# Patient Record
Sex: Female | Born: 1960
Health system: Southern US, Community
[De-identification: ages and names within clinical notes are randomized; demographics above are authoritative.]

## PROBLEM LIST (undated history)

## (undated) DIAGNOSIS — IMO0002 Reserved for concepts with insufficient information to code with codable children: Secondary | ICD-10-CM

## (undated) DIAGNOSIS — G5603 Carpal tunnel syndrome, bilateral upper limbs: Secondary | ICD-10-CM

## (undated) DIAGNOSIS — F419 Anxiety disorder, unspecified: Secondary | ICD-10-CM

## (undated) DIAGNOSIS — R55 Syncope and collapse: Secondary | ICD-10-CM

## (undated) DIAGNOSIS — E785 Hyperlipidemia, unspecified: Secondary | ICD-10-CM

## (undated) DIAGNOSIS — F32A Depression, unspecified: Secondary | ICD-10-CM

## (undated) DIAGNOSIS — M5412 Radiculopathy, cervical region: Secondary | ICD-10-CM

## (undated) DIAGNOSIS — M5416 Radiculopathy, lumbar region: Secondary | ICD-10-CM

## (undated) HISTORY — DX: Hyperlipidemia, unspecified: E78.5

## (undated) HISTORY — DX: Radiculopathy, lumbar region: M54.16

## (undated) HISTORY — DX: Carpal tunnel syndrome, bilateral upper limbs: G56.03

## (undated) HISTORY — DX: Radiculopathy, cervical region: M54.12

## (undated) HISTORY — DX: Reserved for concepts with insufficient information to code with codable children: IMO0002

## (undated) HISTORY — PX: NO PAST SURGERIES: SHX2092

---

## 2005-05-04 ENCOUNTER — Other Ambulatory Visit: Admission: RE | Admit: 2005-05-04 | Discharge: 2005-05-04 | Payer: Self-pay | Admitting: Internal Medicine

## 2005-05-21 ENCOUNTER — Encounter: Admission: RE | Admit: 2005-05-21 | Discharge: 2005-05-21 | Payer: Self-pay | Admitting: Internal Medicine

## 2005-06-14 ENCOUNTER — Encounter: Admission: RE | Admit: 2005-06-14 | Discharge: 2005-06-14 | Payer: Self-pay | Admitting: Internal Medicine

## 2007-01-25 ENCOUNTER — Encounter: Admission: RE | Admit: 2007-01-25 | Discharge: 2007-01-25 | Payer: Self-pay | Admitting: *Deleted

## 2007-01-28 ENCOUNTER — Encounter: Payer: Self-pay | Admitting: Nurse Practitioner

## 2008-10-22 ENCOUNTER — Ambulatory Visit: Payer: Self-pay | Admitting: Gastroenterology

## 2008-10-22 DIAGNOSIS — K648 Other hemorrhoids: Secondary | ICD-10-CM | POA: Insufficient documentation

## 2008-10-22 DIAGNOSIS — K219 Gastro-esophageal reflux disease without esophagitis: Secondary | ICD-10-CM | POA: Insufficient documentation

## 2008-10-22 LAB — CONVERTED CEMR LAB: H Pylori IgG: NEGATIVE

## 2008-11-08 ENCOUNTER — Ambulatory Visit: Payer: Self-pay | Admitting: Internal Medicine

## 2008-11-16 ENCOUNTER — Encounter: Payer: Self-pay | Admitting: Nurse Practitioner

## 2009-01-10 ENCOUNTER — Other Ambulatory Visit: Admission: RE | Admit: 2009-01-10 | Discharge: 2009-01-10 | Payer: Self-pay | Admitting: Internal Medicine

## 2009-01-11 ENCOUNTER — Ambulatory Visit: Payer: Self-pay | Admitting: Internal Medicine

## 2009-01-11 ENCOUNTER — Encounter: Payer: Self-pay | Admitting: Nurse Practitioner

## 2009-02-04 ENCOUNTER — Encounter: Payer: Self-pay | Admitting: Nurse Practitioner

## 2009-02-04 ENCOUNTER — Ambulatory Visit: Payer: Self-pay | Admitting: Internal Medicine

## 2009-03-03 ENCOUNTER — Ambulatory Visit: Payer: Self-pay | Admitting: Internal Medicine

## 2009-03-04 ENCOUNTER — Encounter: Payer: Self-pay | Admitting: Nurse Practitioner

## 2009-03-04 ENCOUNTER — Ambulatory Visit: Payer: Self-pay | Admitting: Internal Medicine

## 2009-03-07 ENCOUNTER — Encounter: Admission: RE | Admit: 2009-03-07 | Discharge: 2009-03-07 | Payer: Self-pay | Admitting: Internal Medicine

## 2009-03-07 ENCOUNTER — Encounter: Payer: Self-pay | Admitting: Nurse Practitioner

## 2009-07-05 ENCOUNTER — Ambulatory Visit: Payer: Self-pay | Admitting: Internal Medicine

## 2009-07-05 ENCOUNTER — Encounter: Payer: Self-pay | Admitting: Nurse Practitioner

## 2009-10-25 ENCOUNTER — Encounter: Payer: Self-pay | Admitting: Nurse Practitioner

## 2009-10-25 ENCOUNTER — Ambulatory Visit: Payer: Self-pay | Admitting: Internal Medicine

## 2009-10-26 ENCOUNTER — Telehealth: Payer: Self-pay | Admitting: Gastroenterology

## 2009-10-28 ENCOUNTER — Ambulatory Visit: Payer: Self-pay | Admitting: Gastroenterology

## 2009-10-28 ENCOUNTER — Encounter: Payer: Self-pay | Admitting: Gastroenterology

## 2009-10-28 DIAGNOSIS — K625 Hemorrhage of anus and rectum: Secondary | ICD-10-CM | POA: Insufficient documentation

## 2009-11-17 ENCOUNTER — Ambulatory Visit: Payer: Self-pay | Admitting: Gastroenterology

## 2009-11-21 ENCOUNTER — Telehealth: Payer: Self-pay | Admitting: Gastroenterology

## 2009-11-22 ENCOUNTER — Encounter: Payer: Self-pay | Admitting: Gastroenterology

## 2009-12-28 ENCOUNTER — Ambulatory Visit: Payer: Self-pay | Admitting: Gastroenterology

## 2009-12-28 DIAGNOSIS — K5090 Crohn's disease, unspecified, without complications: Secondary | ICD-10-CM | POA: Insufficient documentation

## 2009-12-28 LAB — CONVERTED CEMR LAB
Albumin: 3.7 g/dL (ref 3.5–5.2)
Alkaline Phosphatase: 58 units/L (ref 39–117)
BUN: 9 mg/dL (ref 6–23)
Basophils Relative: 0.1 % (ref 0.0–3.0)
CO2: 30 meq/L (ref 19–32)
Calcium: 8.9 mg/dL (ref 8.4–10.5)
Chloride: 102 meq/L (ref 96–112)
Eosinophils Relative: 1.4 % (ref 0.0–5.0)
GFR calc non Af Amer: 114.24 mL/min (ref >60)
Glucose, Bld: 83 mg/dL (ref 70–99)
MCV: 97.5 fL (ref 78.0–100.0)
Monocytes Absolute: 0.6 10*3/uL (ref 0.1–1.0)
Monocytes Relative: 6.5 % (ref 3.0–12.0)
Neutrophils Relative %: 63.6 % (ref 43.0–77.0)
Platelets: 320 10*3/uL (ref 150.0–400.0)
Potassium: 4.2 meq/L (ref 3.5–5.1)
RBC: 3.88 M/uL (ref 3.87–5.11)
Sodium: 138 meq/L (ref 135–145)
Total Protein: 7 g/dL (ref 6.0–8.3)
WBC: 8.7 10*3/uL (ref 4.5–10.5)

## 2010-01-05 ENCOUNTER — Telehealth: Payer: Self-pay | Admitting: Gastroenterology

## 2010-01-20 ENCOUNTER — Telehealth: Payer: Self-pay | Admitting: Gastroenterology

## 2010-03-16 ENCOUNTER — Ambulatory Visit: Payer: Self-pay | Admitting: Internal Medicine

## 2010-03-16 ENCOUNTER — Encounter: Payer: Self-pay | Admitting: Gastroenterology

## 2010-05-24 ENCOUNTER — Ambulatory Visit: Payer: Self-pay | Admitting: Internal Medicine

## 2010-08-31 ENCOUNTER — Ambulatory Visit: Payer: Self-pay | Admitting: Internal Medicine

## 2010-09-05 ENCOUNTER — Encounter: Admission: RE | Admit: 2010-09-05 | Discharge: 2010-09-05 | Payer: Self-pay | Admitting: Internal Medicine

## 2010-10-27 ENCOUNTER — Ambulatory Visit: Payer: Self-pay | Admitting: Internal Medicine

## 2010-12-12 ENCOUNTER — Encounter
Admission: RE | Admit: 2010-12-12 | Discharge: 2010-12-12 | Payer: Self-pay | Source: Home / Self Care | Attending: Sports Medicine | Admitting: Sports Medicine

## 2010-12-12 NOTE — Procedures (Signed)
Summary: Colonoscopy  Patient: Jordan Carrillo Note: All result statuses are Final unless otherwise noted.  Tests: (1) Colonoscopy (COL)   COL Colonoscopy           DONE      [facility]     [address]     [place], [place]  [address]           COLONOSCOPY PROCEDURE REPORT           PATIENT:  Jordan Carrillo, Jordan Carrillo  MR#:  [id number]     BIRTHDATE:  [birth year], 48 yrs. old  GENDER:  female           ENDOSCOPIST:  Barbette Hair. Arlyce Dice, MD     Referred by:           PROCEDURE DATE:  11/17/2009     PROCEDURE:  Colonoscopy with biopsy     ASA CLASS:  Class I     INDICATIONS:  rectal bleeding           MEDICATIONS:   Fentanyl 75 mcg IV, Versed 7 mg IV           DESCRIPTION OF PROCEDURE:   After the risks benefits and     alternatives of the procedure were thoroughly explained, informed     consent was obtained.  Digital rectal exam was performed and     revealed no abnormalities.   The LB [id number] endoscope     was introduced through the anus and advanced to the terminal ileum     which was intubated for a short distance, without limitations.     The quality of the prep was excellent, using MoviPrep.  The     instrument was then slowly withdrawn as the colon was fully     examined.     <<PROCEDUREIMAGES>>     FINDINGS:  Colitis was found. Moderate colitis characterized by     deeply erythematous mucosa, mucosal friability with erosions,     involving the rectum up to 10cm, and a 20cm segment of descending     colon up to the splenic flexure. Intervening areas are normal (see     image1, image15, image16, image17, image22, and image23). Multiple     bxs taken  This was otherwise a normal examination of the colon     (see image5, image6, image7, image8, image9, image10, image11,     image13, image20, and image21).   Retroflexed views in     the rectum revealed no abnormalities.    The scope was then     withdrawn from the patient and the procedure completed.           COMPLICATIONS:  None           ENDOSCOPIC IMPRESSION:     1) Segmental Colitis     2) Otherwise normal examination     RECOMMENDATIONS:     1) Await biopsy results     2) begin lialda 4.8gm daily     3) call office next 1-3 days to schedule followup visit in 2-3     weeks           REPEAT EXAM:  No           ______________________________     Barbette Hair. Arlyce Dice, MD           CC:  Sharlet Salina, MD           n.     Rosalie DoctorMolly Maduro  Rosalio Macadamia at 11/17/2009 03:35 PM           Page 2 of 3   Jordan Carrillo, Jordan Carrillo, [id number]  Note: An exclamation mark (!) indicates a result that was not dispersed into the flowsheet. Document Creation Date: 11/17/2009 3:35 PM _______________________________________________________________________  (1) Order result status: Final Collection or observation date-time: 11/17/2009 15:11 Requested date-time:  Receipt date-time:  Reported date-time:  Referring Physician:   Ordering Physician: Melvia Heaps [phone]) Specimen Source:  Source: Launa Grill Order Number: [id number] Lab site:

## 2010-12-12 NOTE — Letter (Signed)
Summary: 4/22-4/23 notes/M Baxley,MD  4/22-4/23 notes/M Baxley,MD   Imported By: Lester Arthur 11/23/2009 12:41:56  _____________________________________________________________________  External Attachment:    Type:   Image     Comment:   External Document

## 2010-12-12 NOTE — Assessment & Plan Note (Signed)
Summary: COL F/U.Marland KitchenMarland KitchenAS.   History of Present Illness Visit Type: Follow-up Visit Primary GI MD: Melvia Heaps MD [facility] Primary Provider: Marlan Palau, MD Requesting Provider: Marlan Palau, MD Chief Complaint: follow-up colonoscopy/Pt. denies any GI complaints at this time History of Present Illness:   Ms. Barhorst has returned on colonoscopy which demonstrated a segmental colitis.  Biopsies were consistent with inflammatory bowel disease.  I am lialda 4.8 g daily she has had a complete response characterized by the absence of pain, diarrhea or bleeding.  She finished her prescription of lialda one week ago and does not wish to resume it.  She is going to try natural remedies instead.   GI Review of Systems      Denies abdominal pain, acid reflux, belching, bloating, chest pain, dysphagia with liquids, dysphagia with solids, heartburn, loss of appetite, nausea, vomiting, vomiting blood, weight loss, and  weight gain.        Denies anal fissure, black tarry stools, change in bowel habit, constipation, diarrhea, diverticulosis, fecal incontinence, heme positive stool, hemorrhoids, irritable bowel syndrome, jaundice, light color stool, liver problems, rectal bleeding, and  rectal pain.    Current Medications (verified): 1)  Boswellia 375 Mg Caps (Boswellia Serrata) .... One Tablet By Mouth Once Daily 2)  Bromaline Dm 15-1-5 Mg/39ml Elix (Pseudoeph-Bromphen-Dm) .... One Tabelt By Mouth Once Daily  Allergies (verified): No Known Drug Allergies  Past History:  Past Medical History: Reviewed history from 10/28/2009 and no changes required. Current Problems:  INTERNAL HEMORRHOIDS WITHOUT MENTION COMP (ICD-455.0) ESOPHAGEAL REFLUX (ICD-530.81)  Past Surgical History: Reviewed history from 10/22/2008 and no changes required. Unremarkable  Family History: Reviewed history from 10/28/2009 and no changes required. No FH of Colon Cancer: Family History of Uterine Cancer: Mother Colitis ( ?  type)- Sister  Social History: Reviewed history from 10/28/2009 and no changes required. Divorced Patient has never smoked.  Alcohol Use - no Illicit Drug Use - no  Review of Systems  The patient denies allergy/sinus, anemia, anxiety-new, arthritis/joint pain, back pain, blood in urine, breast changes/lumps, change in vision, confusion, cough, coughing up blood, depression-new, fainting, fatigue, fever, headaches-new, hearing problems, heart murmur, heart rhythm changes, itching, menstrual pain, muscle pains/cramps, night sweats, nosebleeds, pregnancy symptoms, shortness of breath, skin rash, sleeping problems, sore throat, swelling of feet/legs, swollen lymph glands, thirst - excessive , urination - excessive , urination changes/pain, urine leakage, vision changes, and voice change.    Vital Signs:  Patient profile:   50 year old female Height:      64 inches Weight:      130 pounds BMI:     22.40 Pulse rate:   84 / minute Pulse rhythm:   regular BP sitting:   102 / 68  (left arm)  Vitals Entered By: Milford Cage NCMA (December 28, 2009 4:08 PM)   Impression & Recommendations:  Problem # 1:  CROHN'S DISEASE (ICD-555.9) Assessment Improved The patient has Crohn's colitis.  She has had an excellent response to lialda.  She wishes to try herbal remedies.  I advised her that she ought to stay on at least a maintenance dose of lialda and, in the absence of this, she likely will have a recurrence.  She has agreed to resume lialda if her symptoms recur.  Other Orders: TLB-CBC Platelet - w/Differential (85025-CBCD) TLB-CMP (Comprehensive Metabolic Pnl) (80053-COMP)  Patient Instructions: 1)  CC Dr. Eden Emms Baxley Prescriptions: LIALDA 1.2 GM TBEC (MESALAMINE) take 2 tabs daily  #60 x 5   Entered  and Authorized by:   Louis Meckel MD   Signed by:   Louis Meckel MD on 12/28/2009   Method used:   Electronically to        [facility] [phone]* (retail)       [address]       [place], [place]  [address]       Ph: [phone]       Fax: [phone]   RxID:   [id number]

## 2010-12-12 NOTE — Letter (Signed)
Summary: 02/26/06-01/28/07 notes/M Baxley,MD  02/26/06-01/28/07 notes/M Baxley,MD   Imported By: Lester Elmo 11/23/2009 12:50:30  _____________________________________________________________________  External Attachment:    Type:   Image     Comment:   External Document

## 2010-12-12 NOTE — Letter (Signed)
Summary: OV note/M Baxley,MD  OV note/M Baxley,MD   Imported By: Lester [place] 11/23/2009 12:46:44  _____________________________________________________________________  External Attachment:    Type:   Image     Comment:   External Document

## 2010-12-12 NOTE — Progress Notes (Signed)
Summary: Triage  Phone Note Call from Patient Call back at Home Phone [phone]   Caller: Patient Call For: Dr. Arlyce Dice Reason for Call: Talk to Nurse Summary of Call: Pt has some questions about her medications and she is having some lower abdominal pain Initial call taken by: Karna Christmas,  January 05, 2010 4:36 PM  Follow-up for Phone Call        Pt. restarted Lialda 2 once daily on 12-31-09. C/O "My belly is growling all the time" and  Intermittent LLQ pain, becomes severe at times. Sees blood on stool after a BM, maybe once daily, but less since she started the Lialda.  Denies fever,n/v.   [facility] PLEASE ADVISE  Follow-up by: Laureen Ochs LPN,  January 05, 2010 4:43 PM  Additional Follow-up for Phone Call Additional follow up Details #1::        add hyomax 0.375mg  two times a day prn Additional Follow-up by: Louis Meckel MD,  January 06, 2010 8:16 AM    Additional Follow-up for Phone Call Additional follow up Details #2::    Above MD orders reviewed with patient. Pt. instructed to call back as needed.  Follow-up by: Laureen Ochs LPN,  January 06, 2010 8:24 AM  New/Updated Medications: HYOSCYAMINE SULFATE CR 0.375 MG  TB12 (HYOSCYAMINE SULFATE) Take one tab twice daily as needed for abd.pain/cramping. Prescriptions: HYOSCYAMINE SULFATE CR 0.375 MG  TB12 (HYOSCYAMINE SULFATE) Take one tab twice daily as needed for abd.pain/cramping.  #30 x 2   Entered by:   Laureen Ochs LPN   Authorized by:   Louis Meckel MD   Signed by:   Laureen Ochs LPN on 09/81/1914   Method used:   Electronically to        [facility]. [phone]* (retail)       [address]       [place], [place]  [address]       Ph: [phone]       Fax: [phone]   RxID:   [id number]

## 2010-12-12 NOTE — Letter (Signed)
Summary: OV notes 3/2-3/26/M Lenord Fellers, MD  OV notes 3/2-3/26/M Lenord Fellers, MD   Imported By: Lester [place] 11/23/2009 12:44:22  _____________________________________________________________________  External Attachment:    Type:   Image     Comment:   External Document

## 2010-12-12 NOTE — Progress Notes (Signed)
Summary: Lab results  Phone Note Call from Patient Call back at Home Phone [phone]   Caller: Patient Call For: Dr. Arlyce Dice Reason for Call: Lab or Test Results Summary of Call: Pt is calling about her lab work results from Feb. Initial call taken by: Karna Christmas,  January 20, 2010 2:31 PM  Follow-up for Phone Call        Pt. is aware that all labs done 12-28-09 are WNL. No further questions. Pt. instructed to call back as needed.  Follow-up by: Laureen Ochs LPN,  January 20, 2010 3:56 PM

## 2010-12-12 NOTE — Miscellaneous (Signed)
  Clinical Lists Changes  Medications: Added new medication of LIALDA 1.2 GM TBEC (MESALAMINE) take 4 tabs daily - Signed Rx of LIALDA 1.2 GM TBEC (MESALAMINE) take 4 tabs daily;  #120 x 5;  Signed;  Entered by: Louis Meckel MD;  Authorized by: Louis Meckel MD;  Method used: Electronically to [facility]. [phone]*, [address] [place], [place], [place]  [address], Ph: [phone], Fax: [phone]    Prescriptions: LIALDA 1.2 GM TBEC (MESALAMINE) take 4 tabs daily  #120 x 5   Entered and Authorized by:   Louis Meckel MD   Signed by:   Louis Meckel MD on 11/17/2009   Method used:   Electronically to        [facility]. [phone]* (retail)       [address]       [place], [place]  [address]       Ph: [phone]       Fax: [phone]   RxID:   [id number]

## 2010-12-12 NOTE — Letter (Signed)
Summary: 01/25/07-11/16/08 notes/MBaxley,MD  01/25/07-11/16/08 notes/MBaxley,MD   Imported By: Lester [place] 11/23/2009 12:48:23  _____________________________________________________________________  External Attachment:    Type:   Image     Comment:   External Document

## 2010-12-12 NOTE — Progress Notes (Signed)
Summary: Triage  Phone Note Call from Patient Call back at [phone]   Caller: Patient Call For: Dr. Arlyce Dice Summary of Call: Pt. said her Lialda medication is making her vomit Initial call taken by: Karna Christmas,  November 21, 2009 3:09 PM  Follow-up for Phone Call        Pt. has Colonoscopy 11-17-09 and began Lialda #4 once daily 11-17-09. She took it Thursday, Friday & Saturday evening, she vomited all three days. She didn't take Sunday or today, no vomiting.She wants to try to take it 2 QAM and 2QPM, instead of all at one time. She will try this and call on Wednesday with an update. Follow-up by: Laureen Ochs LPN,  November 21, 2009 3:55 PM  Additional Follow-up for Phone Call Additional follow up Details #1::        ok Additional Follow-up by: Louis Meckel MD,  November 22, 2009 10:59 AM

## 2010-12-12 NOTE — Letter (Signed)
Summary: Generic Letter  [facility]  [address] [place], [place] [address]   Phone: [phone]  Fax: [phone]    11/22/2009  Jordan Carrillo [address] [place], [place]  [address]  Dear Ms. Larae Grooms,   Your biopsies demonstrated inflammatory changes only.    Please follow the recommendations previously discussed.  Should you have any immediate concerns or questions, feel free to contact me at the office.    Sincerely,  Barbette Hair. Arlyce Dice, M.D., [facility]            Sincerely,   Melvia Heaps MD  Appended Document: Generic Letter November 22, 2009 MRN: [id number]    Jordan Carrillo [address] [place], [place]  [address]      Dear Ms. Larae Grooms,   Your biopsies demonstrated inflammatory changes only.    Please follow the recommendations previously discussed.  Should you have any immediate concerns or questions, feel free to contact me at the office.    Sincerely,  Barbette Hair. Arlyce Dice, M.D., [facility]     Letter mailed 01.12.11

## 2010-12-15 NOTE — Letter (Signed)
Summary: OV note/M Baxley,MD  OV note/M Baxley,MD   Imported By: Lester [place] 11/23/2009 12:51:38  _____________________________________________________________________  External Attachment:    Type:   Image     Comment:   External Document

## 2010-12-26 ENCOUNTER — Encounter: Payer: Self-pay | Admitting: Sports Medicine

## 2011-08-22 ENCOUNTER — Encounter: Payer: Self-pay | Admitting: Internal Medicine

## 2011-08-27 ENCOUNTER — Ambulatory Visit (INDEPENDENT_AMBULATORY_CARE_PROVIDER_SITE_OTHER): Payer: Federal, State, Local not specified - PPO | Admitting: Internal Medicine

## 2011-08-27 ENCOUNTER — Encounter: Payer: Self-pay | Admitting: Internal Medicine

## 2011-08-27 DIAGNOSIS — M5416 Radiculopathy, lumbar region: Secondary | ICD-10-CM

## 2011-08-27 DIAGNOSIS — F32A Depression, unspecified: Secondary | ICD-10-CM

## 2011-08-27 DIAGNOSIS — R51 Headache: Secondary | ICD-10-CM

## 2011-08-27 DIAGNOSIS — M5412 Radiculopathy, cervical region: Secondary | ICD-10-CM

## 2011-08-27 DIAGNOSIS — R519 Headache, unspecified: Secondary | ICD-10-CM

## 2011-08-27 DIAGNOSIS — M6780 Other specified disorders of synovium and tendon, unspecified site: Secondary | ICD-10-CM

## 2011-08-27 DIAGNOSIS — IMO0001 Reserved for inherently not codable concepts without codable children: Secondary | ICD-10-CM

## 2011-08-27 DIAGNOSIS — M94261 Chondromalacia, right knee: Secondary | ICD-10-CM

## 2011-08-27 DIAGNOSIS — F3289 Other specified depressive episodes: Secondary | ICD-10-CM

## 2011-08-27 DIAGNOSIS — IMO0002 Reserved for concepts with insufficient information to code with codable children: Secondary | ICD-10-CM

## 2011-08-27 DIAGNOSIS — M719 Bursopathy, unspecified: Secondary | ICD-10-CM

## 2011-08-27 DIAGNOSIS — M797 Fibromyalgia: Secondary | ICD-10-CM

## 2011-08-27 DIAGNOSIS — G5603 Carpal tunnel syndrome, bilateral upper limbs: Secondary | ICD-10-CM

## 2011-08-27 DIAGNOSIS — M9420 Chondromalacia, unspecified site: Secondary | ICD-10-CM

## 2011-08-27 DIAGNOSIS — G5600 Carpal tunnel syndrome, unspecified upper limb: Secondary | ICD-10-CM

## 2011-08-27 DIAGNOSIS — M6790 Unspecified disorder of synovium and tendon, unspecified site: Secondary | ICD-10-CM

## 2011-08-27 DIAGNOSIS — F329 Major depressive disorder, single episode, unspecified: Secondary | ICD-10-CM

## 2011-09-08 DIAGNOSIS — G5603 Carpal tunnel syndrome, bilateral upper limbs: Secondary | ICD-10-CM | POA: Insufficient documentation

## 2011-09-08 DIAGNOSIS — M5416 Radiculopathy, lumbar region: Secondary | ICD-10-CM | POA: Insufficient documentation

## 2011-09-08 DIAGNOSIS — M5412 Radiculopathy, cervical region: Secondary | ICD-10-CM | POA: Insufficient documentation

## 2011-09-08 DIAGNOSIS — M6780 Other specified disorders of synovium and tendon, unspecified site: Secondary | ICD-10-CM | POA: Insufficient documentation

## 2011-09-08 DIAGNOSIS — M797 Fibromyalgia: Secondary | ICD-10-CM | POA: Insufficient documentation

## 2011-09-08 DIAGNOSIS — M94261 Chondromalacia, right knee: Secondary | ICD-10-CM | POA: Insufficient documentation

## 2011-09-08 DIAGNOSIS — F339 Major depressive disorder, recurrent, unspecified: Secondary | ICD-10-CM | POA: Insufficient documentation

## 2011-09-08 DIAGNOSIS — R519 Headache, unspecified: Secondary | ICD-10-CM | POA: Insufficient documentation
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
## 2011-09-08 NOTE — Patient Instructions (Signed)
Continue Zoloft 100 mg daily as prescribed. Take Sterapred DS 10 mg 6 day dosepak for episode of headache and musculoskeletal pain. Return for fasting lab work in the near future. FMLA form completed today during her visit.

## 2011-10-08 ENCOUNTER — Other Ambulatory Visit: Payer: Self-pay | Admitting: Internal Medicine

## 2011-10-08 ENCOUNTER — Ambulatory Visit (INDEPENDENT_AMBULATORY_CARE_PROVIDER_SITE_OTHER): Payer: Federal, State, Local not specified - PPO | Admitting: Internal Medicine

## 2011-10-08 ENCOUNTER — Encounter: Payer: Self-pay | Admitting: Internal Medicine

## 2011-10-08 DIAGNOSIS — K529 Noninfective gastroenteritis and colitis, unspecified: Secondary | ICD-10-CM

## 2011-10-08 DIAGNOSIS — K5289 Other specified noninfective gastroenteritis and colitis: Secondary | ICD-10-CM

## 2011-10-08 DIAGNOSIS — E785 Hyperlipidemia, unspecified: Secondary | ICD-10-CM

## 2011-10-08 DIAGNOSIS — Z0000 Encounter for general adult medical examination without abnormal findings: Secondary | ICD-10-CM

## 2011-10-08 LAB — CBC WITH DIFFERENTIAL/PLATELET
Basophils Absolute: 0 10*3/uL (ref 0.0–0.1)
Eosinophils Relative: 5 % (ref 0–5)
Lymphocytes Relative: 26 % (ref 12–46)
Lymphs Abs: 2.8 10*3/uL (ref 0.7–4.0)
MCV: 94.8 fL (ref 78.0–100.0)
Neutro Abs: 6.6 10*3/uL (ref 1.7–7.7)
Neutrophils Relative %: 63 % (ref 43–77)
Platelets: 371 10*3/uL (ref 150–400)
RBC: 3.88 MIL/uL (ref 3.87–5.11)
RDW: 12.7 % (ref 11.5–15.5)
WBC: 10.5 10*3/uL (ref 4.0–10.5)

## 2011-10-08 LAB — COMPREHENSIVE METABOLIC PANEL
ALT: 13 U/L (ref 0–35)
AST: 16 U/L (ref 0–37)
CO2: 27 mEq/L (ref 19–32)
Calcium: 9.3 mg/dL (ref 8.4–10.5)
Chloride: 104 mEq/L (ref 96–112)
Creat: 0.72 mg/dL (ref 0.50–1.10)
Sodium: 135 mEq/L (ref 135–145)
Total Protein: 6.8 g/dL (ref 6.0–8.3)

## 2011-10-08 LAB — TSH: TSH: 1.59 u[IU]/mL (ref 0.350–4.500)

## 2011-10-08 LAB — LIPID PANEL
Total CHOL/HDL Ratio: 4.5 Ratio
VLDL: 20 mg/dL (ref 0–40)

## 2011-10-08 NOTE — Progress Notes (Signed)
  Subjective:    Patient ID: Jordan Carrillo, female    DOB: 1960/12/07, 50 y.o.   MRN: [id number]  HPI 50 year old female with history of Crohn's disease having had rectal bleeding for the past month. Says she was unable to afford medication for colitis. She was here in mid October for FMLA form to be completed. Seems to be in better spirits. Fasting lab work was drawn today including iron iron-binding capacity and B12. She is looking forward to going to [place] to see her brother for the holidays. Her daughter is living with her sister in [place] and that is somewhat stressful because daughter does not manage money well. Is considering moving to [place] when her car is paid off in a few months. Affect is much brighter. 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 Musculoskeletal pain has improved.    Review of Systems     Objective:   Physical Exam chest clear; cardiac exam regular rate and rhythm; abdomen is benign        Assessment & Plan:  Crohn's disease  Musculoskeletal pain  Depression  Plan: She is to go back on her colitis medication and if not improved in 4 weeks we should consider GI referral.

## 2011-10-08 NOTE — Patient Instructions (Signed)
Restart colitis medication. Call if not better in 4 weeks.

## 2011-10-09 LAB — IRON AND TIBC: Iron: 116 ug/dL (ref 42–145)

## 2011-10-09 LAB — VITAMIN D 25 HYDROXY (VIT D DEFICIENCY, FRACTURES): Vit D, 25-Hydroxy: 40 ng/mL (ref 30–89)

## 2012-08-07 DIAGNOSIS — F419 Anxiety disorder, unspecified: Secondary | ICD-10-CM | POA: Insufficient documentation

## 2012-08-12 ENCOUNTER — Encounter: Payer: Self-pay | Admitting: Internal Medicine

## 2012-08-12 ENCOUNTER — Ambulatory Visit (INDEPENDENT_AMBULATORY_CARE_PROVIDER_SITE_OTHER): Payer: Federal, State, Local not specified - PPO | Admitting: Internal Medicine

## 2012-08-12 VITALS — BP 110/82 | HR 84 | Wt 136.5 lb

## 2012-08-12 DIAGNOSIS — F341 Dysthymic disorder: Secondary | ICD-10-CM

## 2012-08-12 DIAGNOSIS — F439 Reaction to severe stress, unspecified: Secondary | ICD-10-CM

## 2012-08-12 DIAGNOSIS — F32A Depression, unspecified: Secondary | ICD-10-CM

## 2012-08-12 DIAGNOSIS — F329 Major depressive disorder, single episode, unspecified: Secondary | ICD-10-CM

## 2012-08-12 DIAGNOSIS — M79609 Pain in unspecified limb: Secondary | ICD-10-CM

## 2012-08-12 DIAGNOSIS — F430 Acute stress reaction: Secondary | ICD-10-CM

## 2012-08-13 NOTE — Patient Instructions (Addendum)
Restart Zoloft and Xanax as directed. Return in 4 weeks. Note his been given to stay out of work today. FMLA form to be completed
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
## 2012-08-28 ENCOUNTER — Encounter: Payer: Self-pay | Admitting: Internal Medicine

## 2013-01-06 ENCOUNTER — Ambulatory Visit (INDEPENDENT_AMBULATORY_CARE_PROVIDER_SITE_OTHER): Payer: Federal, State, Local not specified - PPO | Admitting: Internal Medicine

## 2013-01-06 ENCOUNTER — Encounter: Payer: Self-pay | Admitting: Internal Medicine

## 2013-01-06 VITALS — BP 106/68 | Temp 98.9°F | Wt 139.5 lb

## 2013-01-06 DIAGNOSIS — M25519 Pain in unspecified shoulder: Secondary | ICD-10-CM

## 2013-01-06 DIAGNOSIS — R0789 Other chest pain: Secondary | ICD-10-CM

## 2013-01-06 DIAGNOSIS — Z8719 Personal history of other diseases of the digestive system: Secondary | ICD-10-CM

## 2013-01-06 DIAGNOSIS — M545 Low back pain, unspecified: Secondary | ICD-10-CM

## 2013-01-06 DIAGNOSIS — F32A Depression, unspecified: Secondary | ICD-10-CM

## 2013-01-06 DIAGNOSIS — M25511 Pain in right shoulder: Secondary | ICD-10-CM

## 2013-01-06 DIAGNOSIS — F3289 Other specified depressive episodes: Secondary | ICD-10-CM

## 2013-01-06 DIAGNOSIS — G5600 Carpal tunnel syndrome, unspecified upper limb: Secondary | ICD-10-CM

## 2013-01-06 DIAGNOSIS — F439 Reaction to severe stress, unspecified: Secondary | ICD-10-CM

## 2013-01-06 DIAGNOSIS — R079 Chest pain, unspecified: Secondary | ICD-10-CM

## 2013-01-06 DIAGNOSIS — Z733 Stress, not elsewhere classified: Secondary | ICD-10-CM

## 2013-01-06 DIAGNOSIS — R071 Chest pain on breathing: Secondary | ICD-10-CM

## 2013-01-06 DIAGNOSIS — G5601 Carpal tunnel syndrome, right upper limb: Secondary | ICD-10-CM

## 2013-01-06 DIAGNOSIS — F329 Major depressive disorder, single episode, unspecified: Secondary | ICD-10-CM

## 2013-01-06 DIAGNOSIS — M65839 Other synovitis and tenosynovitis, unspecified forearm: Secondary | ICD-10-CM

## 2013-01-06 DIAGNOSIS — M778 Other enthesopathies, not elsewhere classified: Secondary | ICD-10-CM

## 2013-01-06 NOTE — Progress Notes (Signed)
  Subjective:    Patient ID: Jordan Carrillo, female    DOB: 11/26/1960, 52 y.o.   MRN: [id number]  HPI 52 year old female called today asking to be seen on an acute basis. 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 She was able to get  transferred there from the [facility] in January. Took a pay cut to be custodian. Her daughter is living in [place] and has been attending school living with patient's sister. Apparently sister doesn't want her to live there anymore and there's been some stress surrounding that. Daughter does not want come home from [place] to live with patient. 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 After that has developed right wrist swelling and pain. Says some of the fingers in her right hand are numb. Also complaining of some anterior chest pain and some back pain. Says she likes her new job and likes the people there. They are nice to her. Continues to take Zoloft for depression. History of recurrent musculoskeletal pain. History of colitis. Says she also is experiencing some right upper arm pain and right shoulder pain. Says she may have jammed her wrist while shoveling snow.    Review of Systems     Objective:   Physical Exam Right wrist is swollen but she has full range of motion of the right wrist and normal muscle strength in her fingers. Chest is clear to auscultation. Cardiac exam regular rate and rhythm normal S1 and S2. EKG shows some nonspecific ST-T wave changes but no acute changes. She is tender in her left parasternal area. Affect is a bit flat. Extremities without edema. Full range of motion right upper extremity.        Assessment & Plan:  Tendinitis left wrist-advise wearing wrist splint which she has at home - apply ice to left wrist 20 minutes twice daily. Does not want to be out of work.  Musculoskeletal pain right shoulder and arm secondary to shoveling snow  Possible right  carpal tunnel syndrome-complaint of numbness of fingers. If symptoms persist consider nerve conduction studies  Chest wall pain secondary to shoveling snow  Low back pain secondary to shoveling snow  Depression  Situational stress  History of Crohn's disease  Plan: Sterapred DS 10 mg 6 day dosepak take hysterectomy. After that try ibuprofen 800 mg 3 times a day. Prescription given for ibuprofen and Sterapred. Continue Zoloft for depression. Note given at her request to defer her jury duty on March 10. Says she is stressed and doesn't feel like she will be able to focus. Return when necessary  25 minutes spent with patient

## 2013-04-21 DIAGNOSIS — N649 Disorder of breast, unspecified: Secondary | ICD-10-CM | POA: Insufficient documentation

## 2013-05-07 ENCOUNTER — Ambulatory Visit (INDEPENDENT_AMBULATORY_CARE_PROVIDER_SITE_OTHER): Payer: Federal, State, Local not specified - PPO | Admitting: Internal Medicine

## 2013-05-07 ENCOUNTER — Other Ambulatory Visit (HOSPITAL_COMMUNITY)
Admission: RE | Admit: 2013-05-07 | Discharge: 2013-05-07 | Disposition: A | Discharge status: Home / Self Care | Payer: Federal, State, Local not specified - PPO | Source: Ambulatory Visit | Attending: Internal Medicine | Admitting: Internal Medicine

## 2013-05-07 ENCOUNTER — Encounter: Payer: Self-pay | Admitting: Internal Medicine

## 2013-05-07 VITALS — BP 102/80 | HR 76 | Temp 97.1°F | Ht 64.0 in | Wt 137.0 lb

## 2013-05-07 DIAGNOSIS — Z0000 Encounter for general adult medical examination without abnormal findings: Secondary | ICD-10-CM

## 2013-05-07 DIAGNOSIS — F3289 Other specified depressive episodes: Secondary | ICD-10-CM

## 2013-05-07 DIAGNOSIS — M549 Dorsalgia, unspecified: Secondary | ICD-10-CM

## 2013-05-07 DIAGNOSIS — Z13220 Encounter for screening for lipoid disorders: Secondary | ICD-10-CM

## 2013-05-07 DIAGNOSIS — Z01419 Encounter for gynecological examination (general) (routine) without abnormal findings: Secondary | ICD-10-CM | POA: Insufficient documentation

## 2013-05-07 DIAGNOSIS — Z1329 Encounter for screening for other suspected endocrine disorder: Secondary | ICD-10-CM

## 2013-05-07 DIAGNOSIS — Z23 Encounter for immunization: Secondary | ICD-10-CM

## 2013-05-07 DIAGNOSIS — K5190 Ulcerative colitis, unspecified, without complications: Secondary | ICD-10-CM

## 2013-05-07 DIAGNOSIS — F411 Generalized anxiety disorder: Secondary | ICD-10-CM

## 2013-05-07 DIAGNOSIS — Z130 Encounter for screening for diseases of the blood and blood-forming organs and certain disorders involving the immune mechanism: Secondary | ICD-10-CM

## 2013-05-07 DIAGNOSIS — F32A Depression, unspecified: Secondary | ICD-10-CM

## 2013-05-07 DIAGNOSIS — K219 Gastro-esophageal reflux disease without esophagitis: Secondary | ICD-10-CM

## 2013-05-07 DIAGNOSIS — F329 Major depressive disorder, single episode, unspecified: Secondary | ICD-10-CM

## 2013-05-07 LAB — COMPREHENSIVE METABOLIC PANEL
Alkaline Phosphatase: 60 U/L (ref 39–117)
Glucose, Bld: 87 mg/dL (ref 70–99)
Sodium: 136 mEq/L (ref 135–145)
Total Bilirubin: 0.6 mg/dL (ref 0.3–1.2)
Total Protein: 6.8 g/dL (ref 6.0–8.3)

## 2013-05-07 LAB — CBC WITH DIFFERENTIAL/PLATELET
Basophils Relative: 0 % (ref 0–1)
Eosinophils Absolute: 0.3 10*3/uL (ref 0.0–0.7)
MCH: 30.3 pg (ref 26.0–34.0)
MCHC: 32.9 g/dL (ref 30.0–36.0)
Neutrophils Relative %: 63 % (ref 43–77)
Platelets: 389 10*3/uL (ref 150–400)
RDW: 12.7 % (ref 11.5–15.5)

## 2013-05-07 LAB — POCT URINALYSIS DIPSTICK
Ketones, UA: NEGATIVE
Leukocytes, UA: NEGATIVE
Protein, UA: NEGATIVE
Spec Grav, UA: 1.015
pH, UA: 6.5

## 2013-05-07 LAB — LIPID PANEL
LDL Cholesterol: 165 mg/dL — ABNORMAL HIGH (ref 0–99)
Triglycerides: 172 mg/dL — ABNORMAL HIGH (ref <150)
VLDL: 34 mg/dL (ref 0–40)

## 2013-05-07 MED ORDER — TETANUS-DIPHTH-ACELL PERTUSSIS 5-2.5-18.5 LF-MCG/0.5 IM SUSP: 0.5000 mL | Freq: Once | INTRAMUSCULAR | Status: DC
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
## 2013-05-10 ENCOUNTER — Encounter: Payer: Self-pay | Admitting: Internal Medicine

## 2013-05-10 NOTE — Patient Instructions (Addendum)
Take Celebrex samples daily for 2-3 weeks for back pain. Continue Zoloft. RTC one year or prn

## 2013-05-28 DIAGNOSIS — Z0289 Encounter for other administrative examinations: Secondary | ICD-10-CM

## 2013-07-28 ENCOUNTER — Ambulatory Visit (INDEPENDENT_AMBULATORY_CARE_PROVIDER_SITE_OTHER): Payer: Federal, State, Local not specified - PPO | Admitting: Family Medicine

## 2013-07-28 ENCOUNTER — Ambulatory Visit: Payer: Federal, State, Local not specified - PPO

## 2013-07-28 VITALS — BP 108/64 | HR 79 | Temp 98.6°F | Resp 17 | Ht 63.5 in | Wt 139.0 lb

## 2013-07-28 DIAGNOSIS — R0600 Dyspnea, unspecified: Secondary | ICD-10-CM

## 2013-07-28 DIAGNOSIS — R51 Headache: Secondary | ICD-10-CM

## 2013-07-28 DIAGNOSIS — J209 Acute bronchitis, unspecified: Secondary | ICD-10-CM

## 2013-07-28 DIAGNOSIS — J329 Chronic sinusitis, unspecified: Secondary | ICD-10-CM

## 2013-07-28 DIAGNOSIS — R0609 Other forms of dyspnea: Secondary | ICD-10-CM

## 2013-07-28 DIAGNOSIS — R0989 Other specified symptoms and signs involving the circulatory and respiratory systems: Secondary | ICD-10-CM

## 2013-07-28 LAB — POCT CBC
Granulocyte percent: 70.7 %G (ref 37–80)
HCT, POC: 40.4 % (ref 37.7–47.9)
Hemoglobin: 12.6 g/dL (ref 12.2–16.2)
Lymph, poc: 2.8 (ref 0.6–3.4)
MCH, POC: 31.3 pg — AB (ref 27–31.2)
MCHC: 31.2 g/dL — AB (ref 31.8–35.4)
MCV: 100.5 fL — AB (ref 80–97)
MID (cbc): 0.5 (ref 0–0.9)
MPV: 8.2 fL (ref 0–99.8)
POC Granulocyte: 8 — AB (ref 2–6.9)
POC LYMPH PERCENT: 24.7 %L (ref 10–50)
POC MID %: 4.6 %M (ref 0–12)
Platelet Count, POC: 415 10*3/uL (ref 142–424)
RBC: 4.02 M/uL — AB (ref 4.04–5.48)
RDW, POC: 12.6 %
WBC: 11.3 10*3/uL — AB (ref 4.6–10.2)

## 2013-07-28 MED ORDER — HYDROCODONE-ACETAMINOPHEN 5-325 MG PO TABS: 1.0000 | ORAL_TABLET | Freq: Four times a day (QID) | ORAL | Status: DC | PRN

## 2013-07-28 MED ORDER — AMOXICILLIN 875 MG PO TABS: 875.0000 mg | ORAL_TABLET | Freq: Two times a day (BID) | ORAL | Status: DC

## 2013-07-28 MED ORDER — MONTELUKAST SODIUM 10 MG PO TABS: 10.0000 mg | ORAL_TABLET | Freq: Every day | ORAL | Status: DC

## 2013-07-28 NOTE — Progress Notes (Addendum)
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 He's had some tightness which is making her short of breath as well. She's also had a headache for 3 weeks. This is diffuse headache associated with some tightness in the back of her neck.  Patient's had no loss of consciousness or head injury. She has no history of asthma. She has no nausea or vomiting or left shoulder pain. She's had no diaphoresis.  Patient denies cough or sinus pressure.  Objective: Alert and appropriate, in no acute distress HEENT: Unremarkable Chest: Clear Heart: Regular no murmur Abdomen: Soft nontender Skin: Clear of rashes [facility] reading (PRIMARY) by  Dr. Milus Glazier: CXR:  Increased markings right mediastinum Results for orders placed in visit on 07/28/13  POCT CBC      Result Value Range   WBC 11.3 (*) 4.6 - 10.2 K/uL   Lymph, poc 2.8  0.6 - 3.4   POC LYMPH PERCENT 24.7  10 - 50 %L   MID (cbc) 0.5  0 - 0.9   POC MID % 4.6  0 - 12 %M   POC Granulocyte 8.0 (*) 2 - 6.9   Granulocyte percent 70.7  37 - 80 %G   RBC 4.02 (*) 4.04 - 5.48 M/uL   Hemoglobin 12.6  12.2 - 16.2 g/dL   HCT, POC 16.1  09.6 - 47.9 %   MCV 100.5 (*) 80 - 97 fL   MCH, POC 31.3 (*) 27 - 31.2 pg   MCHC 31.2 (*) 31.8 - 35.4 g/dL   RDW, POC 04.5     Platelet Count, POC 415  142 - 424 K/uL   MPV 8.2  0 - 99.8 fL  EKG:  Few nonspecific ST-T wave changes [facility] reading (PRIMARY) by  Dr. Milus Glazier:  CXR:  Increased right perihilar markings.   Assessment: I am concerned that as patient may have an early pneumonia associated with sinusitis, she may need to be on antibiotics.  Dyspnea - Plan: DG Chest 2 View, Sedimentation rate, amoxicillin (AMOXIL) 875 MG tablet, montelukast (SINGULAIR) 10 MG tablet  Headache(784.0) - Plan: POCT CBC, Comprehensive metabolic panel, EKG 12-Lead, EKG 12-Lead, Sedimentation rate, HYDROcodone-acetaminophen (NORCO/VICODIN) 5-325 MG per tablet, CANCELED:  POCT SEDIMENTATION RATE  Sinusitis - Plan: amoxicillin (AMOXIL) 875 MG tablet, HYDROcodone-acetaminophen (NORCO/VICODIN) 5-325 MG per tablet, montelukast (SINGULAIR) 10 MG tablet  Acute bronchitis - Plan: amoxicillin (AMOXIL) 875 MG tablet, HYDROcodone-acetaminophen (NORCO/VICODIN) 5-325 MG per tablet, montelukast (SINGULAIR) 10 MG tablet  Signed, Elvina Sidle, MD

## 2013-07-28 NOTE — Patient Instructions (Addendum)
Please return if not significantly better in 2 days.

## 2013-07-29 LAB — COMPREHENSIVE METABOLIC PANEL
ALT: 15 U/L (ref 0–35)
AST: 16 U/L (ref 0–37)
Albumin: 4.1 g/dL (ref 3.5–5.2)
Alkaline Phosphatase: 64 U/L (ref 39–117)
BUN: 8 mg/dL (ref 6–23)
CO2: 27 mEq/L (ref 19–32)
Calcium: 9.9 mg/dL (ref 8.4–10.5)
Chloride: 103 mEq/L (ref 96–112)
Creat: 0.67 mg/dL (ref 0.50–1.10)
Glucose, Bld: 101 mg/dL — ABNORMAL HIGH (ref 70–99)
Potassium: 4.3 mEq/L (ref 3.5–5.3)
Sodium: 140 mEq/L (ref 135–145)
Total Bilirubin: 0.3 mg/dL (ref 0.3–1.2)
Total Protein: 7.6 g/dL (ref 6.0–8.3)

## 2013-07-29 LAB — SEDIMENTATION RATE: Sed Rate: 40 mm/hr — ABNORMAL HIGH (ref 0–22)

## 2013-07-31 ENCOUNTER — Encounter: Payer: Self-pay | Admitting: Internal Medicine

## 2013-07-31 ENCOUNTER — Ambulatory Visit (INDEPENDENT_AMBULATORY_CARE_PROVIDER_SITE_OTHER): Payer: Federal, State, Local not specified - PPO | Admitting: Internal Medicine

## 2013-07-31 VITALS — BP 118/68 | Temp 97.8°F | Wt 138.5 lb

## 2013-07-31 DIAGNOSIS — S161XXA Strain of muscle, fascia and tendon at neck level, initial encounter: Secondary | ICD-10-CM

## 2013-07-31 DIAGNOSIS — Z8669 Personal history of other diseases of the nervous system and sense organs: Secondary | ICD-10-CM

## 2013-07-31 DIAGNOSIS — R071 Chest pain on breathing: Secondary | ICD-10-CM

## 2013-07-31 DIAGNOSIS — H6590 Unspecified nonsuppurative otitis media, unspecified ear: Secondary | ICD-10-CM

## 2013-07-31 DIAGNOSIS — H6593 Unspecified nonsuppurative otitis media, bilateral: Secondary | ICD-10-CM

## 2013-07-31 DIAGNOSIS — R0789 Other chest pain: Secondary | ICD-10-CM

## 2013-07-31 DIAGNOSIS — G43901 Migraine, unspecified, not intractable, with status migrainosus: Secondary | ICD-10-CM

## 2013-07-31 NOTE — Patient Instructions (Addendum)
In addition to Amoxicillin and when necessary Hydrocodone/APAP which had been previously prescribed, take Sterapred DS 10 mg 12 day dosepak. Call if not better next week or sooner if worse.

## 2013-07-31 NOTE — Progress Notes (Signed)
  Subjective:    Patient ID: Jordan Carrillo, female    DOB: 06-13-1961, 52 y.o.   MRN: [id number]  HPI  [facility] with complaint of headache for 3 weeks. Patient says initially headache was a dull headache that progressed over the weekend of September 13 th  to a throbbing pounding headache. At times, she was nauseated. Says she's had decreased appetite but kept fluids down. She did vomit some but has had no fever or chills. At urgent care, she was diagnosed with sinusitis. White blood cell count was 11,300. Chest x-ray was negative. She was placed on Hydrocodone/APAP which she says has not relieved her headache.  She was also treated with Amoxicillin. 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 History of protracted headaches/migraine headaches. Denies being under excessive stress. Also has felt some chest tightness recently.    Review of Systems     Objective:   Physical Exam PERRLA. Extraocular movements are full. Fundi are benign. Pharynx is clear. TMs are full bilaterally but not red. Neck is supple without thyromegaly or adenopathy. She has palpable muscle spasm left sternocleidomastoid muscle. She also has left anterior chest wall tenderness in the para sternal area. Her chest is clear to auscultation. Cardiac exam regular rate and rhythm        Assessment & Plan:  Chest wall pain-patient reassured this should resolve  Bilateral serous otitis media  Status migrainosus  Muscle spasm sternocleidomastoid muscle  Plan: Sterapred DS 10 mg 12 day dosepak. Does not want medication for nausea. Does not want to be out of work tomorrow. Continue Amoxicillin and Hydrocodone/APAP as previously prescribed.

## 2013-09-18 DIAGNOSIS — R918 Other nonspecific abnormal finding of lung field: Secondary | ICD-10-CM | POA: Insufficient documentation

## 2014-04-12 ENCOUNTER — Ambulatory Visit (INDEPENDENT_AMBULATORY_CARE_PROVIDER_SITE_OTHER): Payer: Federal, State, Local not specified - PPO | Admitting: Internal Medicine

## 2014-04-12 ENCOUNTER — Encounter: Payer: Self-pay | Admitting: Internal Medicine

## 2014-04-12 VITALS — BP 110/80 | HR 72 | Temp 97.2°F | Wt 141.5 lb

## 2014-04-12 DIAGNOSIS — F419 Anxiety disorder, unspecified: Secondary | ICD-10-CM

## 2014-04-12 DIAGNOSIS — K589 Irritable bowel syndrome without diarrhea: Secondary | ICD-10-CM

## 2014-04-12 DIAGNOSIS — Z130 Encounter for screening for diseases of the blood and blood-forming organs and certain disorders involving the immune mechanism: Secondary | ICD-10-CM

## 2014-04-12 DIAGNOSIS — F329 Major depressive disorder, single episode, unspecified: Secondary | ICD-10-CM

## 2014-04-12 DIAGNOSIS — F32A Depression, unspecified: Secondary | ICD-10-CM

## 2014-04-12 DIAGNOSIS — F341 Dysthymic disorder: Secondary | ICD-10-CM

## 2014-04-12 DIAGNOSIS — G4700 Insomnia, unspecified: Secondary | ICD-10-CM

## 2014-04-12 DIAGNOSIS — Z13220 Encounter for screening for lipoid disorders: Secondary | ICD-10-CM

## 2014-04-12 LAB — CBC WITH DIFFERENTIAL/PLATELET
BASOS PCT: 1 % (ref 0–1)
Basophils Absolute: 0.1 10*3/uL (ref 0.0–0.1)
EOS ABS: 0.2 10*3/uL (ref 0.0–0.7)
Eosinophils Relative: 2 % (ref 0–5)
HCT: 38.1 % (ref 36.0–46.0)
HEMOGLOBIN: 13 g/dL (ref 12.0–15.0)
LYMPHS ABS: 3 10*3/uL (ref 0.7–4.0)
Lymphocytes Relative: 31 % (ref 12–46)
MCH: 31.3 pg (ref 26.0–34.0)
MCHC: 34.1 g/dL (ref 30.0–36.0)
MCV: 91.8 fL (ref 78.0–100.0)
MONOS PCT: 6 % (ref 3–12)
Monocytes Absolute: 0.6 10*3/uL (ref 0.1–1.0)
NEUTROS ABS: 5.8 10*3/uL (ref 1.7–7.7)
NEUTROS PCT: 60 % (ref 43–77)
PLATELETS: 403 10*3/uL — AB (ref 150–400)
RBC: 4.15 MIL/uL (ref 3.87–5.11)
RDW: 13.3 % (ref 11.5–15.5)
WBC: 9.7 10*3/uL (ref 4.0–10.5)

## 2014-04-12 LAB — LIPID PANEL
CHOLESTEROL: 255 mg/dL — AB (ref 0–200)
HDL: 49 mg/dL (ref >39)
LDL Cholesterol: 176 mg/dL — ABNORMAL HIGH (ref 0–99)
TRIGLYCERIDES: 150 mg/dL — AB (ref <150)
Total CHOL/HDL Ratio: 5.2 Ratio
VLDL: 30 mg/dL (ref 0–40)

## 2014-04-12 MED ORDER — SERTRALINE HCL 100 MG PO TABS: 100.0000 mg | ORAL_TABLET | Freq: Every day | ORAL | Status: DC

## 2014-04-13 ENCOUNTER — Ambulatory Visit (INDEPENDENT_AMBULATORY_CARE_PROVIDER_SITE_OTHER): Payer: Self-pay | Admitting: Radiology

## 2014-04-13 ENCOUNTER — Ambulatory Visit (INDEPENDENT_AMBULATORY_CARE_PROVIDER_SITE_OTHER): Payer: Federal, State, Local not specified - PPO | Admitting: Neurology

## 2014-04-13 DIAGNOSIS — Z0289 Encounter for other administrative examinations: Secondary | ICD-10-CM

## 2014-04-13 DIAGNOSIS — R209 Unspecified disturbances of skin sensation: Secondary | ICD-10-CM

## 2014-04-13 DIAGNOSIS — M79609 Pain in unspecified limb: Secondary | ICD-10-CM

## 2014-04-13 NOTE — Procedures (Signed)
     HISTORY:  Jordan BlamerShanmatie Parlier is a 53 year old patient with a history of neck discomfort and discomfort down into the right arm and hand, including the right ring finger. The patient is being evaluated for possible neuropathy or a cervical radiculopathy. She indicates that she has had symptoms for about one month.  NERVE CONDUCTION STUDIES:  Nerve conduction studies were performed on the right upper extremity. The distal motor latencies and motor amplitudes for the median and ulnar nerves were within normal limits. The F wave latencies and nerve conduction velocities for these nerves were also normal. The sensory latencies for the median, radial, and ulnar nerves were normal.   EMG STUDIES:  EMG study was performed on the right upper extremity:  The first dorsal interosseous muscle reveals 2 to 4 K units with full recruitment. No fibrillations or positive waves were noted. The abductor pollicis brevis muscle reveals 2 to 4 K units with full recruitment. No fibrillations or positive waves were noted. The extensor indicis proprius muscle reveals 1 to 3 K units with full recruitment. No fibrillations or positive waves were noted. The pronator teres muscle reveals 2 to 3 K units with full recruitment. No fibrillations or positive waves were noted. The biceps muscle reveals 1 to 2 K units with full recruitment. No fibrillations or positive waves were noted. The triceps muscle reveals 2 to 4 K units with full recruitment. No fibrillations or positive waves were noted. The anterior deltoid muscle reveals 2 to 3 K units with full recruitment. No fibrillations or positive waves were noted. The cervical paraspinal muscles were tested at 2 levels. No abnormalities of insertional activity were seen at either level tested. There was good relaxation.   IMPRESSION:  Nerve conduction studies done on the right upper extremity were unremarkable. No evidence of a neuropathy is seen. EMG evaluation of the  right upper extremity was unremarkable, without evidence of an overlying cervical radiculopathy.  Marlan Palau. Keith Daijah Scrivens MD 04/13/2014 1:21 PM  [facility] [address] [place], [place] [id number]  Phone [phone] Fax [phone]

## 2014-05-09 NOTE — Patient Instructions (Signed)
Begin Zoloft for depression and Klonopin for anxiety and sleep. CBC and fasting lipid panel pending.

## 2014-05-09 NOTE — Progress Notes (Signed)
   Subjective:    Patient ID: Jordan BlamerShanmatie Carrillo, female    DOB: 04-Feb-1961, 53 y.o.   MRN: [id number]  HPI  53 year old Female last seen September 2014. She continues to have stress at work. Says when she is absent, coworkers make her job difficult when she returns. She and her daughter continued to have a strained relationship. Daughter remains in [place]. Patient was in [place] recently. Says she has had diarrhea for 12 weeks. She subsequently saw gastroenterologist Dr.Ahdoot in [place], [place] and she says she is seen before. She had an upper endoscopy and colonoscopy done 02/19/2014 there. Was diagnosed with GE reflux and hemorrhoids patient had hiatal hernia and esophagitis. Had mild antritis. Was told to avoid nonsteroidal anti-inflammatory medications. Patient had internal hemorrhoids. Biopsies were taken to check for colitis. Patient has beenoff Lialda and has been using homeopathic remedies. Mucosa was grossly normal on colonoscopy. I do not have pathology results. She does have a history of inflammatory bowel disease. Patient also says that she's had some vaginal bleeding and was told by GYN physician in October that she had a cyst on her ovary. She did not respond to hormonal therapy.  Patient says that she is depressed. Not very happy with her job although it was supposed to be less stressful than the previous job she had at the [employer]. Has felt fatigued.           Review of Systems     Objective:   Physical Exam Skin warm and dry. Chest clear to auscultation. Cardiac exam regular rate and rhythm normal S1 and S2. Abdomen no hepatosplenomegaly masses or tenderness. Affect is depressed.       Assessment & Plan:  Anxiety  Insomnia  Depression  Situational stress  History of inflammatory bowel disease  Hyperlipidemia-fasting lipid panel shows elevated LDL and total cholesterol  Plan: Patient does not want to be on statin therapy. Prefers homeopathic  remedies. No evidence of iron deficiency anemia. Begin Zoloft for depression and Klonopin for anxiety and sleep. Call with progress report in 4 weeks.  25 minutes spent with patient discussing these issues.

## 2014-05-31 ENCOUNTER — Telehealth: Payer: Self-pay | Admitting: Internal Medicine

## 2014-05-31 ENCOUNTER — Other Ambulatory Visit: Payer: Self-pay

## 2014-05-31 MED ORDER — PANTOPRAZOLE SODIUM 40 MG PO TBEC: 40.0000 mg | DELAYED_RELEASE_TABLET | Freq: Every day | ORAL | Status: DC

## 2014-05-31 MED ORDER — CLONAZEPAM 0.5 MG PO TABS: 0.5000 mg | ORAL_TABLET | Freq: Two times a day (BID) | ORAL | Status: DC | PRN

## 2014-05-31 NOTE — Telephone Encounter (Signed)
Call in Klonopin 0.5 mg bid #60 with 2 refills, Protonix 40 mg daily with prn one year refills, make sure she has Zoloft 100 mg daily for prn one year

## 2014-05-31 NOTE — Telephone Encounter (Signed)
[facility] @ [facility] @ [place]; she reviewed patient's profile.  Zoloft ordered daily on 6/1 w/11 refills.  Klonopin last ordered Feb 2015.  Nothing on patient profile regarding Acid Reflux.

## 2014-06-15 ENCOUNTER — Encounter: Payer: Self-pay | Admitting: Podiatry

## 2014-06-15 ENCOUNTER — Ambulatory Visit (INDEPENDENT_AMBULATORY_CARE_PROVIDER_SITE_OTHER): Payer: Federal, State, Local not specified - PPO | Admitting: Podiatry

## 2014-06-15 VITALS — BP 105/66 | HR 76 | Ht 64.0 in | Wt 139.0 lb

## 2014-06-15 DIAGNOSIS — M79609 Pain in unspecified limb: Secondary | ICD-10-CM

## 2014-06-15 DIAGNOSIS — M21969 Unspecified acquired deformity of unspecified lower leg: Secondary | ICD-10-CM

## 2014-06-15 DIAGNOSIS — M722 Plantar fascial fibromatosis: Secondary | ICD-10-CM

## 2014-06-15 MED ORDER — NABUMETONE 500 MG PO TABS: 500.0000 mg | ORAL_TABLET | Freq: Two times a day (BID) | ORAL | Status: DC

## 2014-06-15 NOTE — Progress Notes (Signed)
Subjective: Pain in right heel for several days. Sharp pain at plantar medial heel right. lso has lower back pain. Right heel pain is making the back pain worse.  Objective: Neurovascular status are within normal. No open lesions, no edema or erythema. Positive of thick plantar callus on plantar medial and posterior aspect of left heel.  Orthopedic: Cavus type foot with increased sagittal plane motion of the first ray right. Radiographic examination reveal cavus type foot with plantar heel spur right, increased lateral deviation angle of Calcaneocuboid angle R>L, elevated first ray right.   Assessment: Plantar fasciitis right with heel spur. Hypermobile first ray right.  Plan: Reviewed clinical findings and available treatment options. All plantar heel callus on right heel debrided. Patient wanted to prepare for Orthotics and skip injection. Both feet were casted for Orthotics.

## 2014-06-15 NOTE — Patient Instructions (Signed)
Seen for pain in right heel. Noted of weakened first Metatarsal bone. May benefit from custom orthotics. Both feet are casted for Orthotics. Will call patient when they are ready.

## 2014-06-23 ENCOUNTER — Ambulatory Visit (INDEPENDENT_AMBULATORY_CARE_PROVIDER_SITE_OTHER): Payer: Federal, State, Local not specified - PPO | Admitting: Emergency Medicine

## 2014-06-23 VITALS — BP 120/82 | HR 101 | Temp 97.6°F | Resp 18 | Ht 64.0 in | Wt 143.0 lb

## 2014-06-23 DIAGNOSIS — F411 Generalized anxiety disorder: Secondary | ICD-10-CM

## 2014-06-23 DIAGNOSIS — R200 Anesthesia of skin: Secondary | ICD-10-CM

## 2014-06-23 DIAGNOSIS — R202 Paresthesia of skin: Secondary | ICD-10-CM

## 2014-06-23 DIAGNOSIS — R209 Unspecified disturbances of skin sensation: Secondary | ICD-10-CM

## 2014-06-23 LAB — POCT CBC
GRANULOCYTE PERCENT: 56.1 % (ref 37–80)
HEMATOCRIT: 41.1 % (ref 37.7–47.9)
Hemoglobin: 13.1 g/dL (ref 12.2–16.2)
LYMPH, POC: 3.9 — AB (ref 0.6–3.4)
MCH, POC: 30.6 pg (ref 27–31.2)
MCHC: 31.8 g/dL (ref 31.8–35.4)
MCV: 96.2 fL (ref 80–97)
MID (cbc): 0.6 (ref 0–0.9)
MPV: 7.3 fL (ref 0–99.8)
PLATELET COUNT, POC: 353 10*3/uL (ref 142–424)
POC GRANULOCYTE: 5.8 (ref 2–6.9)
POC LYMPH %: 37.9 % (ref 10–50)
POC MID %: 6 % (ref 0–12)
RBC: 4.28 M/uL (ref 4.04–5.48)
RDW, POC: 14.2 %
WBC: 10.3 10*3/uL — AB (ref 4.6–10.2)

## 2014-06-23 LAB — GLUCOSE, POCT (MANUAL RESULT ENTRY): POC Glucose: 89 mg/dl (ref 70–99)

## 2014-06-23 MED ORDER — ALPRAZOLAM 0.25 MG PO TABS: ORAL_TABLET | ORAL | Status: DC

## 2014-06-23 MED ORDER — ALPRAZOLAM 0.25 MG PO TABS
0.2500 mg | ORAL_TABLET | Freq: Once | ORAL | Status: AC
  Administered 2014-06-23: 0.25 mg via ORAL

## 2014-06-23 NOTE — Patient Instructions (Signed)

## 2014-06-23 NOTE — Progress Notes (Signed)
   Subjective:    Patient ID: Jordan Carrillo, female    DOB: 23-Sep-1961, 53 y.o.   MRN: [id number]  HPI 53 year old female here for dizziness and lip and arm numbness. Pt was at a class for work. Pt works for [employer]. She has been feeling stressed at work lately. She was fatigued during the class. During the class her "eyes started to twitch and lips became numb." Pt is unsure if she has had a panic attack before. Says she has problems with numbness in her arms and feet in the past. She has gone through counseling in the past for anxiety; has gone to [facility]. Has a gynecologist that prescribes her Klonopin.    Review of Systems  Constitutional: Positive for fatigue.  Cardiovascular: Negative for chest pain.  Neurological: Positive for dizziness and numbness.  Psychiatric/Behavioral: The patient is nervous/anxious.        Objective:   Physical Exam  Constitutional: She appears well-developed and well-nourished.  Cardiovascular: Normal rate, regular rhythm and normal heart sounds.   No murmur heard. Pulmonary/Chest: Effort normal and breath sounds normal.   neurological cranial nerves II through XII intact deep tendon reflexes to us motor strength 5 out of 5.  EKG shows some low-voltage no specific ST-T changes seen Results for orders placed in visit on 06/23/14  POCT CBC      Result Value Ref Range   WBC 10.3 (*) 4.6 - 10.2 K/uL   Lymph, poc 3.9 (*) 0.6 - 3.4   POC LYMPH PERCENT 37.9  10 - 50 %L   MID (cbc) 0.6  0 - 0.9   POC MID % 6.0  0 - 12 %M   POC Granulocyte 5.8  2 - 6.9   Granulocyte percent 56.1  37 - 80 %G   RBC 4.28  4.04 - 5.48 M/uL   Hemoglobin 13.1  12.2 - 16.2 g/dL   HCT, POC 28.441.1  13.237.7 - 47.9 %   MCV 96.2  80 - 97 fL   MCH, POC 30.6  27 - 31.2 pg   MCHC 31.8  31.8 - 35.4 g/dL   RDW, POC 44.014.2     Platelet Count, POC 353  142 - 424 K/uL   MPV 7.3  0 - 99.8 fL  GLUCOSE, POCT (MANUAL RESULT ENTRY)      Result Value Ref Range   POC Glucose 89  70 - 99  mg/dl       Assessment & Plan:  I do think she would benefit from counseling. She was given #10 Xanax 0.25 to use during the day if she feels overwhelmed. She was given the number for [facility]   for  counseling.[phone]. Advised to talk to the people at the [facility] program but she stated she used up all of her time with them. She is frustrated in that she does not feel that things will change at work. She has had similar problems working with the [employer] in other cities where she has worked. I personally performed the services described in this documentation, which was scribed in my presence. The recorded information has been reviewed and is accurate.

## 2014-06-24 LAB — TSH: TSH: 4.152 u[IU]/mL (ref 0.350–4.500)

## 2014-06-28 ENCOUNTER — Encounter: Payer: Self-pay | Admitting: Emergency Medicine

## 2014-09-22 ENCOUNTER — Encounter: Payer: Self-pay | Admitting: Internal Medicine

## 2014-10-25 ENCOUNTER — Ambulatory Visit (INDEPENDENT_AMBULATORY_CARE_PROVIDER_SITE_OTHER): Payer: Federal, State, Local not specified - PPO | Admitting: Internal Medicine

## 2014-10-25 ENCOUNTER — Encounter: Payer: Self-pay | Admitting: Internal Medicine

## 2014-10-25 VITALS — BP 98/62 | HR 69 | Temp 97.5°F | Wt 141.0 lb

## 2014-10-25 DIAGNOSIS — E785 Hyperlipidemia, unspecified: Secondary | ICD-10-CM | POA: Diagnosis not present

## 2014-10-25 DIAGNOSIS — R5382 Chronic fatigue, unspecified: Secondary | ICD-10-CM | POA: Diagnosis not present

## 2014-10-25 DIAGNOSIS — Z8659 Personal history of other mental and behavioral disorders: Secondary | ICD-10-CM

## 2014-10-25 DIAGNOSIS — R202 Paresthesia of skin: Secondary | ICD-10-CM | POA: Diagnosis not present

## 2014-10-25 LAB — POCT URINALYSIS DIPSTICK
Bilirubin, UA: NEGATIVE
Glucose, UA: NEGATIVE
Ketones, UA: NEGATIVE
Leukocytes, UA: NEGATIVE
NITRITE UA: NEGATIVE
PH UA: 7.5
PROTEIN UA: NEGATIVE
Spec Grav, UA: 1.01
Urobilinogen, UA: NEGATIVE

## 2014-10-25 LAB — CBC WITH DIFFERENTIAL/PLATELET
BASOS ABS: 0 10*3/uL (ref 0.0–0.1)
Basophils Relative: 0 % (ref 0–1)
EOS PCT: 2 % (ref 0–5)
Eosinophils Absolute: 0.2 10*3/uL (ref 0.0–0.7)
HCT: 37.6 % (ref 36.0–46.0)
Hemoglobin: 12.6 g/dL (ref 12.0–15.0)
LYMPHS PCT: 33 % (ref 12–46)
Lymphs Abs: 3 10*3/uL (ref 0.7–4.0)
MCH: 31.5 pg (ref 26.0–34.0)
MCHC: 33.5 g/dL (ref 30.0–36.0)
MCV: 94 fL (ref 78.0–100.0)
MPV: 9.2 fL — ABNORMAL LOW (ref 9.4–12.4)
Monocytes Absolute: 0.5 10*3/uL (ref 0.1–1.0)
Monocytes Relative: 5 % (ref 3–12)
NEUTROS PCT: 60 % (ref 43–77)
Neutro Abs: 5.5 10*3/uL (ref 1.7–7.7)
PLATELETS: 380 10*3/uL (ref 150–400)
RBC: 4 MIL/uL (ref 3.87–5.11)
RDW: 12.8 % (ref 11.5–15.5)
WBC: 9.2 10*3/uL (ref 4.0–10.5)

## 2014-10-25 MED ORDER — CLOTRIMAZOLE-BETAMETHASONE 1-0.05 % EX CREA: TOPICAL_CREAM | CUTANEOUS | Status: DC

## 2014-10-25 MED ORDER — TRAMADOL HCL 50 MG PO TABS: 50.0000 mg | ORAL_TABLET | Freq: Three times a day (TID) | ORAL | Status: DC | PRN

## 2014-10-25 NOTE — Patient Instructions (Addendum)
Await lab results. Call us with results of your appointment today with occupational physician. May need to see neurologist regarding paresthesias of legs and arms. Tramadol refilled.

## 2014-10-26 LAB — LIPID PANEL
Cholesterol: 257 mg/dL — ABNORMAL HIGH (ref 0–200)
HDL: 57 mg/dL (ref >39)
LDL Cholesterol: 176 mg/dL — ABNORMAL HIGH (ref 0–99)
Total CHOL/HDL Ratio: 4.5 Ratio
Triglycerides: 118 mg/dL (ref <150)
VLDL: 24 mg/dL (ref 0–40)

## 2014-10-26 LAB — COMPREHENSIVE METABOLIC PANEL
ALT: 13 U/L (ref 0–35)
AST: 18 U/L (ref 0–37)
Albumin: 3.9 g/dL (ref 3.5–5.2)
Alkaline Phosphatase: 60 U/L (ref 39–117)
BILIRUBIN TOTAL: 0.5 mg/dL (ref 0.2–1.2)
BUN: 12 mg/dL (ref 6–23)
CO2: 28 meq/L (ref 19–32)
CREATININE: 0.67 mg/dL (ref 0.50–1.10)
Calcium: 9.2 mg/dL (ref 8.4–10.5)
Chloride: 101 mEq/L (ref 96–112)
Glucose, Bld: 89 mg/dL (ref 70–99)
Potassium: 4 mEq/L (ref 3.5–5.3)
Sodium: 136 mEq/L (ref 135–145)
Total Protein: 7.1 g/dL (ref 6.0–8.3)

## 2014-10-26 LAB — T4, FREE: FREE T4: 1.17 ng/dL (ref 0.80–1.80)

## 2014-10-26 LAB — TSH: TSH: 2.018 u[IU]/mL (ref 0.350–4.500)

## 2014-10-26 LAB — ALT: ALT: 13 U/L (ref 0–35)

## 2014-11-15 ENCOUNTER — Telehealth: Payer: Self-pay | Admitting: Internal Medicine

## 2014-11-15 NOTE — Telephone Encounter (Signed)
The dispostion states she may need to see neurologist for further evaluation. It does not say I would order MRI.

## 2014-11-15 NOTE — Telephone Encounter (Signed)
She saw Dr. Anne Hahn Summer 2015.

## 2014-11-15 NOTE — Telephone Encounter (Signed)
Spoke with patient regarding Neurology referral request pt was seen by Dr Anne Hahn in June 2015 regarding this matter which is a workmans comp case. Advised patient she would need to follow up with Dr Anne Hahn for increased symptoms since he is the Dr [facility] referred her to for treatment of her symptoms

## 2014-11-15 NOTE — Telephone Encounter (Signed)
Called patient discussed with her Dr Lenord Fellers response she wants to know if we will refer her to Neurology

## 2014-11-15 NOTE — Telephone Encounter (Signed)
Patient advised that you indicated that you would order and MRI for her if she had any trouble getting one r/t a worker's comp case.  She said the worker's comp case has now been closed and they are dragging their feet.  She wants to know if you will order the MRI.  She said this was not something that was documented that you were just standing talking and that you would know what she was referring to.  Advised patient that I would still need to send a message to the physician to document the call.

## 2014-11-25 ENCOUNTER — Other Ambulatory Visit: Payer: Self-pay | Admitting: Orthopaedic Surgery

## 2014-11-25 DIAGNOSIS — M545 Low back pain: Secondary | ICD-10-CM

## 2014-12-02 ENCOUNTER — Ambulatory Visit
Admission: RE | Admit: 2014-12-02 | Discharge: 2014-12-02 | Disposition: A | Discharge status: Home / Self Care | Payer: Federal, State, Local not specified - PPO | Source: Ambulatory Visit | Attending: Orthopaedic Surgery | Admitting: Orthopaedic Surgery

## 2014-12-02 DIAGNOSIS — M545 Low back pain: Secondary | ICD-10-CM

## 2015-01-26 ENCOUNTER — Ambulatory Visit (INDEPENDENT_AMBULATORY_CARE_PROVIDER_SITE_OTHER): Payer: Federal, State, Local not specified - PPO | Admitting: Emergency Medicine

## 2015-01-26 VITALS — BP 120/78 | HR 87 | Temp 98.1°F | Resp 16 | Ht 64.0 in | Wt 144.0 lb

## 2015-01-26 DIAGNOSIS — M79669 Pain in unspecified lower leg: Secondary | ICD-10-CM

## 2015-01-26 DIAGNOSIS — G894 Chronic pain syndrome: Secondary | ICD-10-CM | POA: Diagnosis not present

## 2015-01-26 DIAGNOSIS — E041 Nontoxic single thyroid nodule: Secondary | ICD-10-CM

## 2015-01-26 DIAGNOSIS — F410 Panic disorder [episodic paroxysmal anxiety] without agoraphobia: Secondary | ICD-10-CM

## 2015-01-26 LAB — POCT CBC
GRANULOCYTE PERCENT: 75.6 % (ref 37–80)
HEMATOCRIT: 41 % (ref 37.7–47.9)
Hemoglobin: 13 g/dL (ref 12.2–16.2)
Lymph, poc: 2.6 (ref 0.6–3.4)
MCH, POC: 31.3 pg — AB (ref 27–31.2)
MCHC: 31.8 g/dL (ref 31.8–35.4)
MCV: 98.5 fL — AB (ref 80–97)
MID (CBC): 0.5 (ref 0–0.9)
MPV: 6.9 fL (ref 0–99.8)
POC Granulocyte: 9.5 — AB (ref 2–6.9)
POC LYMPH %: 20.7 % (ref 10–50)
POC MID %: 3.7 %M (ref 0–12)
Platelet Count, POC: 448 10*3/uL — AB (ref 142–424)
RBC: 4.16 M/uL (ref 4.04–5.48)
RDW, POC: 15.1 %
WBC: 12.6 10*3/uL — AB (ref 4.6–10.2)

## 2015-01-26 LAB — COMPREHENSIVE METABOLIC PANEL
ALBUMIN: 4 g/dL (ref 3.5–5.2)
ALT: 14 U/L (ref 0–35)
AST: 15 U/L (ref 0–37)
Alkaline Phosphatase: 56 U/L (ref 39–117)
BILIRUBIN TOTAL: 0.3 mg/dL (ref 0.2–1.2)
BUN: 11 mg/dL (ref 6–23)
CHLORIDE: 103 meq/L (ref 96–112)
CO2: 25 mEq/L (ref 19–32)
Calcium: 8.9 mg/dL (ref 8.4–10.5)
Creat: 0.71 mg/dL (ref 0.50–1.10)
Glucose, Bld: 104 mg/dL — ABNORMAL HIGH (ref 70–99)
Potassium: 4.4 mEq/L (ref 3.5–5.3)
SODIUM: 138 meq/L (ref 135–145)
Total Protein: 7.1 g/dL (ref 6.0–8.3)

## 2015-01-26 LAB — TSH: TSH: 0.925 u[IU]/mL (ref 0.350–4.500)

## 2015-01-26 LAB — C-REACTIVE PROTEIN: CRP: 0.5 mg/dL (ref <0.60)

## 2015-01-26 LAB — CK: Total CK: 109 U/L (ref 7–177)

## 2015-01-26 MED ORDER — SERTRALINE HCL 50 MG PO TABS: ORAL_TABLET | ORAL | Status: DC

## 2015-01-26 NOTE — Patient Instructions (Signed)
Call [phone] to set up a psychiatry appointment to see Dr. Geannie Risen. [address] [place], [place] [address]

## 2015-01-26 NOTE — Progress Notes (Signed)
Subjective:  This chart was scribed for Earl Lites, MD by Elveria Rising, Medial Scribe. This patient was seen in room 4 and the patient's care was started at 10:24 AM.    Patient ID: Jordan Carrillo, female    DOB: [birth year], 54 y.o.   MRN: [id number] Chief Complaint  Patient presents with  . Leg Pain    1 year on and off  . Headache    On and off 4 years  . Nodule in thyroid    Dx in [place]    Leg Pain   Headache  Pertinent negatives include no fever.   HPI Comments: Jordan Carrillo is a 54 y.o. female who presents to the [facility] complaining of progressively worsening panic attacks and development of pain in her calves. Patient with history of cervical radiculopathy that causes her headaches. Patient reports recent cortisone injection in her neck from her neurologist located in [place]: Dr. Estella Husk at [facility]. Patient reports starting Prednisone which has initiated depressive episode. Patient states that she has not received a diagnosis for any of her symptoms, but reports that her providers are just "feeding her medicine" for speculated issues. Patient reports that her PCP, Dr. Baird Kay, no longer wants to see her because she has told her that her pain "is all in her head." Patient states that she knows her symptoms are not self manifested because of the pain she experiences and the time she has taken off from work. Patient reports that she feels miserable when waking up and makes it difficult for her to get out of bed.  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 Patient reports that with since onset of her pain her quality of life has severely decreased. Patient is not married and lives along and her daughter, 89, lives in [place]; her parents died long and she has no other family here in the states. Patient reports experiencing loneliness and  states that she doesn't have any friends with whom she spends time. Patient reports desire to sell her house and to move to [place] so that she can "have a simpler life and regroup." Patient denies suicidal ideation, but reports wanting to go to sleep and not wake up due to her pain severity.   Patient Active Problem List   Diagnosis Date Noted  . Plantar fasciitis of right foot 06/15/2014  . Metatarsal deformity 06/15/2014  . History of migraine headaches 07/31/2013  . Depression 09/08/2011  . Bilateral carpal tunnel syndrome 09/08/2011  . Persistent headaches 09/08/2011  . Chondromalacia of right knee 09/08/2011  . Tendinosis 09/08/2011  . Fibromyalgia syndrome 09/08/2011  . Cervical radiculopathy 09/08/2011  . Lumbar radiculopathy 09/08/2011  . CROHN'S DISEASE 12/28/2009  . RECTAL BLEEDING 10/28/2009  . INTERNAL HEMORRHOIDS WITHOUT MENTION COMP 10/22/2008  . ESOPHAGEAL REFLUX 10/22/2008   Past Medical History  Diagnosis Date  . Hyperlipidemia   . Cervical radiculopathy   . Lumbar radiculopathy   . Bilateral carpal tunnel syndrome   . Migraine   . Ulcer    No past surgical history on file. No Known Allergies Prior to Admission medications   Medication Sig Start Date End Date Taking? Authorizing Provider  clonazePAM (KLONOPIN) 0.5 MG tablet Take 1 tablet (0.5 mg total) by mouth 2 (two) times daily as needed for anxiety. 05/31/14  Yes Margaree Mackintosh, MD  clotrimazole-betamethasone (LOTRISONE) cream Use externally in  genital and rectal areas twice daily 10/25/14  Yes Margaree Mackintosh, MD  Mesalamine (LIALDA PO) Take by mouth 2 (two) times daily.   Yes Historical Provider, MD  nabumetone (RELAFEN) 500 MG tablet Take 1 tablet (500 mg total) by mouth 2 (two) times daily. 06/15/14  Yes Myeong O Sheard, DPM  pantoprazole (PROTONIX) 40 MG tablet  08/16/14  Yes Historical Provider, MD  traMADol (ULTRAM) 50 MG tablet Take 1 tablet (50 mg total) by mouth 3 (three) times daily as needed. 10/25/14   Yes Margaree Mackintosh, MD   History   Social History  . Marital Status: Single    Spouse Name: N/A  . Number of Children: N/A  . Years of Education: N/A   Occupational History  . Not on file.   Social History Main Topics  . Smoking status: Never Smoker   . Smokeless tobacco: Not on file  . Alcohol Use: No  . Drug Use: No  . Sexual Activity: No   Other Topics Concern  . Not on file   Social History Narrative      Review of Systems  Constitutional: Negative for fever and chills.  Musculoskeletal: Positive for myalgias.  Neurological: Positive for headaches.  Psychiatric/Behavioral: Negative for suicidal ideas. The patient is nervous/anxious.        Objective:   Physical Exam  CONSTITUTIONAL: Well developed/well nourished; tearful female who is not in any distress HEAD: Normocephalic/atraumatic EYES: EOMI/PERRL ENMT: Mucous membranes moist NECK: supple no meningeal signs SPINE/BACK:entire spine nontender CV: S1/S2 noted, no murmurs/rubs/gallops noted LUNGS: Lungs are clear to auscultation bilaterally, no apparent distress ABDOMEN: soft, nontender, no rebound or guarding, bowel sounds noted throughout abdomen GU:no cva tenderness NEURO: Pt is awake/alert/appropriate, moves all extremitiesx4.  No facial droop. Reflexes are 2+ and symmetrical, motor strength is normal EXTREMITIES: pulses normal/equal, full ROM; tenderness over calves thighs and back, reflexes 2+ SKIN: warm, color normal PSYCH: no abnormalities of mood noted, alert and oriented to situation  Filed Vitals:   01/26/15 0934  BP: 120/78  Pulse: 87  Temp: 98.1 F (36.7 C)  TempSrc: Oral  Resp: 16  Height: 5\' 4"  (1.626 m)  Weight: 144 lb (65.318 kg)  SpO2: 98%   Anxiety Index Score of 78 Which Is Extreme Anxiety Beck Depression Score of 21 Which Is in the Moderate Depression Range.      Assessment & Plan:  Patient placed on Zoloft to see if that will help with her chronic pain. I confirmed with  the patient she is no longer taking her ultram. Referral made to psychiatry. Ultrasound ordered of the neck. Referral made to rheumatology to see if they can help with her what sounds like fibromyalgia.I personally performed the services described in this documentation, which was scribed in my presence. The recorded information has been reviewed and is accurate.

## 2015-01-26 NOTE — Progress Notes (Signed)
Subjective:  This chart was scribed for Earl Lites, MD by Elveria Rising, Medial Scribe. This patient was seen in room 4 and the patient's care was started at 10:24 AM.    Patient ID: Jordan Carrillo, female    DOB: 01-18-1961, 54 y.o.   MRN: [id number] Chief Complaint  Patient presents with   Leg Pain    1 year on and off   Headache    On and off 4 years   Nodule in thyroid    Dx in [place]    HPI HPI Comments: Jordan Carrillo is a 54 y.o. female who presents to the [facility] complaining of progressively worsening panic attacks and development of pain in her calves. Patient with history of cervical radiculopathy that causes her headaches. Patient reports recent cortisone injection in her neck from her neurologist located in [place]: Dr. Estella Husk at [facility]. Patient reports starting Prednisone which has initiated depressive episode. Patient states that she has not received a diagnosis for any of her symptoms, but reports that her providers are just "feeding her medicine" for speculated issues. Patient reports that her PCP, Dr. Baird Kay, no longer wants to see her because she has told her that her pain "is all in her head." Patient states that she knows her symptoms are not self manifested because of the pain she experiences and the time she has taken off from work. Patient reports that she feels miserable when waking up and makes it difficult for her to get out of bed.  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 Patient reports that with since onset of her pain her quality of life has severely decreased. Patient is not married and lives along and her daughter, 70, lives in [place]; her parents died long and she has no other family here in the states. Patient reports experiencing loneliness and states that she doesn't have any friends with whom she spends  time. Patient reports desire to sell her house and to move to [place] so that she can "have a simpler life and regroup." Patient denies suicidal ideation, but reports wanting to go to sleep and not wake up due to her pain severity.   Patient Active Problem List   Diagnosis Date Noted   Plantar fasciitis of right foot 06/15/2014   Metatarsal deformity 06/15/2014   History of migraine headaches 07/31/2013   Depression 09/08/2011   Bilateral carpal tunnel syndrome 09/08/2011   Persistent headaches 09/08/2011   Chondromalacia of right knee 09/08/2011   Tendinosis 09/08/2011   Fibromyalgia syndrome 09/08/2011   Cervical radiculopathy 09/08/2011   Lumbar radiculopathy 09/08/2011   CROHN'S DISEASE 12/28/2009   RECTAL BLEEDING 10/28/2009   INTERNAL HEMORRHOIDS WITHOUT MENTION COMP 10/22/2008   ESOPHAGEAL REFLUX 10/22/2008   Past Medical History  Diagnosis Date   Hyperlipidemia    Cervical radiculopathy    Lumbar radiculopathy    Bilateral carpal tunnel syndrome    Migraine    Ulcer    No past surgical history on file. No Known Allergies Prior to Admission medications   Medication Sig Start Date End Date Taking? Authorizing Provider  clonazePAM (KLONOPIN) 0.5 MG tablet Take 1 tablet (0.5 mg total) by mouth 2 (two) times daily as needed for anxiety. 05/31/14  Yes Margaree Mackintosh, MD  clotrimazole-betamethasone (LOTRISONE) cream Use externally in genital and rectal areas twice daily 10/25/14  Yes Luanna Cole [place],  MD  Mesalamine (LIALDA PO) Take by mouth 2 (two) times daily.   Yes Historical Provider, MD  nabumetone (RELAFEN) 500 MG tablet Take 1 tablet (500 mg total) by mouth 2 (two) times daily. 06/15/14  Yes Myeong O Sheard, DPM  pantoprazole (PROTONIX) 40 MG tablet  08/16/14  Yes Historical Provider, MD  traMADol (ULTRAM) 50 MG tablet Take 1 tablet (50 mg total) by mouth 3 (three) times daily as needed. 10/25/14  Yes Margaree Mackintosh, MD   History   Social History    Marital Status: Single    Spouse Name: N/A   Number of Children: N/A   Years of Education: N/A   Occupational History   Not on file.   Social History Main Topics   Smoking status: Never Smoker    Smokeless tobacco: Not on file   Alcohol Use: No   Drug Use: No   Sexual Activity: No   Other Topics Concern   Not on file   Social History Narrative      Review of Systems  Constitutional: Negative for fever and chills.  Musculoskeletal: Positive for myalgias.  Neurological: Positive for headaches.  Psychiatric/Behavioral: Negative for suicidal ideas. The patient is nervous/anxious.        Objective:   Physical Exam  CONSTITUTIONAL: Well developed/well nourished; tearful female who is not in any distress HEAD: Normocephalic/atraumatic EYES: EOMI/PERRL ENMT: Mucous membranes moist NECK: supple no meningeal signs SPINE/BACK:entire spine nontender CV: S1/S2 noted, no murmurs/rubs/gallops noted LUNGS: Lungs are clear to auscultation bilaterally, no apparent distress ABDOMEN: soft, nontender, no rebound or guarding, bowel sounds noted throughout abdomen GU:no cva tenderness NEURO: Pt is awake/alert/appropriate, moves all extremitiesx4.  No facial droop. Reflexes are 2+ and symmetrical, motor strength is normal EXTREMITIES: pulses normal/equal, full ROM; tenderness over calves thighs and back, reflexes 2+ SKIN: warm, color normal PSYCH: no abnormalities of mood noted, alert and oriented to situation  Filed Vitals:   01/26/15 0934  BP: 120/78  Pulse: 87  Temp: 98.1 F (36.7 C)  TempSrc: Oral  Resp: 16  Height:  (1.626 m)  Weight: 144 lb (65.318 kg)  SpO2: 98%   Anxiety Index Score of 78 Which Is Extreme Anxiety Beck Depression Score of 21 Which Is in the Moderate Depression Range.      Assessment & Plan:  Patient placed on Zoloft to see if that will help with her chronic pain. Referral made to psychiatry. Ultrasound ordered of the neck. Referral made  to rheumatology to see if they can help with her what sounds like fibromyalgia.I personally performed the services described in this documentation, which was scribed in my presence. The recorded information has been reviewed and is accurate.

## 2015-01-27 LAB — SEDIMENTATION RATE: SED RATE: 14 mm/h (ref 0–30)

## 2015-02-07 NOTE — Progress Notes (Signed)
   Subjective:    Patient ID: Jordan Carrillo, female    DOB: 1961/07/19, 54 y.o.   MRN: [id number]  HPI  54 year old Female continues to work for [employer]. Current employment is in the [place] area. 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 Says it's hard work and she is not happy there. Was not happy with previous job at [employer]. Has a history of depression which is long-standing. Daughter is living in [place]. Daughter always asking for money which is stressful for patient. Patient is talking about moving to another state. In April 2015 she saw gastroenterologist in [place] whom apparently she had seen before. She had an upper endoscopy and colonoscopy. Was diagnosed with GE reflux, hemorrhoids, hiatal hernia and esophagitis. She had mild antritis. Biopsies were taken for colitis. Patient had been awfully [place] and using homeopathic remedies. Mucosa was grossly normal on colonoscopy. Pathology results were not sent here. She does have a prior history of inflammatory bowel disease. Patient indicates she has no energy and has been losing her balance. Complaining of burning and stinging in her legs. Complaining of generalized weakness and bilateral arm paresthesia. Apparently has appointment with occupational physician today. Apparently this may be related to Workmen's Comp. case for which I do not have details.    Review of Systems     Objective:   Physical Exam  Neck is supple. Chest clear to auscultation. Cardiac exam regular rate and rhythm normal S1 and S2. Muscle strength is normal in upper and lower extremities. Deep tendon reflexes 2+ and symmetrical. Alert and oriented 3. Affect is slightly depressed      Assessment & Plan:  History of inflammatory bowel disease  History depression  History of chronic musculoskeletal pain  Plan: Have refilled tramadol. I think she said see neurologist for evaluation of paresthesias. May need MRI of the brain to see if  she has multiple sclerosis. We'll try to arrange appointment with neurology. Labs drawn today including CBC TSH and lipid panel .  Addendum: Has significant hyperlipidemia. Needs to consider statin therapy pending appointment with neurologist.

## 2015-02-10 ENCOUNTER — Ambulatory Visit
Admission: RE | Admit: 2015-02-10 | Discharge: 2015-02-10 | Disposition: A | Discharge status: Home / Self Care | Payer: Federal, State, Local not specified - PPO | Source: Ambulatory Visit | Attending: Emergency Medicine | Admitting: Emergency Medicine

## 2015-02-10 DIAGNOSIS — E041 Nontoxic single thyroid nodule: Secondary | ICD-10-CM

## 2015-02-11 ENCOUNTER — Telehealth: Payer: Self-pay

## 2015-02-11 NOTE — Telephone Encounter (Signed)
Patient returned call about ultrasound. Please call  Back at [phone]

## 2015-02-11 NOTE — Telephone Encounter (Signed)
Advised pt of results. Mailed copy.

## 2015-03-19 ENCOUNTER — Ambulatory Visit (INDEPENDENT_AMBULATORY_CARE_PROVIDER_SITE_OTHER): Payer: Federal, State, Local not specified - PPO | Admitting: Emergency Medicine

## 2015-03-19 VITALS — BP 100/68 | HR 91 | Temp 97.9°F | Resp 24 | Ht 63.5 in | Wt 145.2 lb

## 2015-03-19 DIAGNOSIS — F329 Major depressive disorder, single episode, unspecified: Secondary | ICD-10-CM

## 2015-03-19 DIAGNOSIS — G894 Chronic pain syndrome: Secondary | ICD-10-CM | POA: Diagnosis not present

## 2015-03-19 DIAGNOSIS — M79669 Pain in unspecified lower leg: Secondary | ICD-10-CM

## 2015-03-19 DIAGNOSIS — M797 Fibromyalgia: Secondary | ICD-10-CM

## 2015-03-19 DIAGNOSIS — E041 Nontoxic single thyroid nodule: Secondary | ICD-10-CM | POA: Diagnosis not present

## 2015-03-19 DIAGNOSIS — R202 Paresthesia of skin: Secondary | ICD-10-CM | POA: Diagnosis not present

## 2015-03-19 DIAGNOSIS — F32A Depression, unspecified: Secondary | ICD-10-CM

## 2015-03-19 MED ORDER — SERTRALINE HCL 50 MG PO TABS
ORAL_TABLET | ORAL | Status: DC
Start: 2015-03-19 — End: 2016-06-15

## 2015-03-19 NOTE — Patient Instructions (Addendum)
Please call Jordan Carrillo [phone] for counseling. His address [address] [place] [address]

## 2015-03-19 NOTE — Progress Notes (Addendum)
Subjective:  This chart was scribed for Earl Lites, MD by [facility], medical scribe at [facility].The patient was seen in exam room 11 and the patient's care was started at 1:34 PM.   Patient ID: Jordan Carrillo, female    DOB: 25-Jan-1961, 54 y.o.   MRN: [id number] Chief Complaint  Patient presents with  . Follow-up  . Depression    Per Triage Screen   HPI HPI Comments: Jordan Carrillo is a 54 y.o. female who presents to [facility] here for a follow up. Feeling better today, in better spirits today. Last seen 01/26/2015. She had an ultra sound of her neck which showed a benign appearing nodule. Pt was also referred to a therapist and rheumatology. She was also placed on Zoloft 25 mg once daily. The ultrasound of her thyroid was normal. Pt called rheumatology several times and has not gotten a call back. She would like a referral to another rheumatologist. Seen by a therapist. Dr. Tenafly Sink told her she needs to see a psychologist. He did not change her medications. She is still taking the Zoloft 25 mg, it has been helpful. 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 Pt is taking an herbal supplement for possible fibromyalgia which has been helpful. Pt still has pain in her back that radiates down to her left calf. She says it is very deep in her muscles. Her sleep pattern improved, she has more stability but occasional is overwhelmed. She wants to move to [place] and has a plan to volunteer at [place]. Pt is still working at the post office.  Review of Systems  Musculoskeletal: Positive for myalgias and back pain.      Objective:  BP 100/68 mmHg  Pulse 91  Temp(Src) 97.9 F (36.6 C) (Oral)  Resp 24  Ht 5' 3.5" (1.613 m)  Wt 145 lb 4 oz (65.885 kg)  BMI 25.32 kg/m2  SpO2 97%  Physical Exam CONSTITUTIONAL: Well developed/well nourished. Occasionally tearful, but most of the time seems cheerful HEAD: Normocephalic/atraumatic EYES:  EOMI/PERRL ENMT: Mucous membranes moist NECK: supple no meningeal signs SPINE/BACK: Tenderness of the lumbar spine, reflexes are 2+ and symmetrical. Motor strength is 5/5. CV: S1/S2 noted, no murmurs/rubs/gallops noted LUNGS: Lungs are clear to auscultation bilaterally, no apparent distress ABDOMEN: soft, nontender, no rebound or guarding, bowel sounds noted throughout abdomen GU:no cva tenderness NEURO: Pt is awake/alert/appropriate, moves all extremitiesx4.  No facial droop.   EXTREMITIES: pulses normal/equal, full ROM SKIN: warm, color normal PSYCH: no abnormalities of mood noted, alert and oriented to situation    Assessment & Plan:     1. Chronic pain syndrome I encouraged her to try an exercise program. I think yoga would be a good choice for her.  2. Depression Referral made to Vernell Leep for therapy - sertraline (ZOLOFT) 50 MG tablet; Take one half to one tablet daily  Dispense: 30 tablet; Refill: 5  3. Calf pain, unspecified laterality I suspect this pain is coming from her back. I encouraged her to be on an exercise program.  4. Left thyroid nodule Patient to have follow-up ultrasound in 9 months  5. Paresthesias The paresthesias on the right side of her leg I suspect are coming from her back.. We will see what the rheumatologist says about her back discomfort and chronic pain syndrome. I gave her the option after she sees a rheumatologist to see a neurologist and see what their opinion is regarding this right  leg paresthesia.   I personally performed the services described in this documentation, which was scribed in my presence. The recorded information has been reviewed and is accurate.  Lesle Chris, MD  [facility], [facility], [facility]  03/19/2015 1:52 PM

## 2015-03-22 ENCOUNTER — Telehealth: Payer: Self-pay

## 2015-03-22 NOTE — Telephone Encounter (Signed)
We can try Dr.  Jacqualyn Posey office again. I think we need to call directly because she had a lot of problems making the appointment for the patient. If week and go ahead and get an appointment time that would be great. The initial referral was made to her office but  she never heard back

## 2015-03-22 NOTE — Telephone Encounter (Signed)
Dr Cleta Alberts, Dr. Nickola Major office called stating they cannot see pt for his diagnosis. Can we refer him somewhere else? Please advise.

## 2015-06-16 ENCOUNTER — Other Ambulatory Visit: Payer: Self-pay | Admitting: Specialist

## 2015-06-16 DIAGNOSIS — R51 Headache: Principal | ICD-10-CM

## 2015-06-16 DIAGNOSIS — R519 Headache, unspecified: Secondary | ICD-10-CM

## 2015-06-16 DIAGNOSIS — R270 Ataxia, unspecified: Secondary | ICD-10-CM

## 2015-07-11 ENCOUNTER — Ambulatory Visit (INDEPENDENT_AMBULATORY_CARE_PROVIDER_SITE_OTHER): Payer: Federal, State, Local not specified - PPO | Admitting: Family Medicine

## 2015-07-11 VITALS — BP 118/80 | HR 96 | Temp 98.1°F | Resp 16 | Ht 63.5 in | Wt 144.6 lb

## 2015-07-11 DIAGNOSIS — M5441 Lumbago with sciatica, right side: Secondary | ICD-10-CM

## 2015-07-11 DIAGNOSIS — M25559 Pain in unspecified hip: Secondary | ICD-10-CM | POA: Diagnosis not present

## 2015-07-11 DIAGNOSIS — M25569 Pain in unspecified knee: Secondary | ICD-10-CM | POA: Diagnosis not present

## 2015-07-11 LAB — POCT SEDIMENTATION RATE: POCT SED RATE: 43 mm/hr — AB (ref 0–22)

## 2015-07-11 MED ORDER — CYCLOBENZAPRINE HCL 5 MG PO TABS: 5.0000 mg | ORAL_TABLET | Freq: Three times a day (TID) | ORAL | Status: DC | PRN

## 2015-07-11 MED ORDER — PREDNISONE 20 MG PO TABS: ORAL_TABLET | ORAL | Status: DC

## 2015-07-11 NOTE — Progress Notes (Signed)
   Subjective:    Patient ID: Jordan Carrillo, female    DOB: 01-18-1961, 54 y.o.   MRN: [id number] This chart was scribed for Elvina Sidle, MD by Littie Deeds, Medical Scribe. This patient was seen in Room 9 and the patient's care was started at 6:09 PM.   HPI HPI Comments: Jordan Carrillo is a 54 y.o. female who presents to the [facility] complaining of gradual onset lower back pain that started about 1 week ago. Patient notes that she has a disc protrusion since she had a fall at work in 2013, and she has been having back problems since then. She believes her pain flared up due to doing something strenuous last week. Patient also reports having burning bilateral feet pain and generalized arthralgias. Her pain is worse with sitting and bearing weight. She has tried heat which does provide relief to her back pain. Patient is on her feet all day at work. She has seen a rheumatologist and was told she likely does not have rheumatoid arthritis.  Patient works at the [employer].  Review of Systems  Musculoskeletal: Positive for back pain and arthralgias.       Objective:   Physical Exam CONSTITUTIONAL: Well developed/well nourished HEAD: Normocephalic/atraumatic EYES: EOM/PERRL ENMT: Mucous membranes moist NECK: supple no meningeal signs SPINE: Mild tenderness of the lumbar area CV: S1/S2 noted, no murmurs/rubs/gallops noted LUNGS: Lungs are clear to auscultation bilaterally, no apparent distress ABDOMEN: soft, nontender, no rebound or guarding GU: no cva tenderness NEURO: Pt is awake/alert, moves all extremitiesx4 EXTREMITIES: pulses normal, full ROM, mild puffiness of the fingers diffusely, no nodules subcutaneously Straight-leg raising is positive bilaterally SKIN: warm, color normal PSYCH: no abnormalities of mood noted      Assessment & Plan:   This chart was scribed in my presence and reviewed by me personally.    ICD-9-CM ICD-10-CM   1. Chronic  arthralgias of knees and hips, unspecified laterality 719.45 M25.559 POCT SEDIMENTATION RATE    M25.569 Rheumatoid factor     ANA     predniSONE (DELTASONE) 20 MG tablet     cyclobenzaprine (FLEXERIL) 5 MG tablet  2. Right-sided low back pain with right-sided sciatica 724.3 M54.41 predniSONE (DELTASONE) 20 MG tablet     cyclobenzaprine (FLEXERIL) 5 MG tablet   Recommend following up with Dr. Cleta Alberts when she gets a physical in the next few days  Signed, Elvina Sidle, MD

## 2015-07-12 LAB — RHEUMATOID FACTOR: Rhuematoid fact SerPl-aCnc: <10 IU/mL (ref <=14)

## 2015-07-13 LAB — ANA: Anti Nuclear Antibody(ANA): NEGATIVE

## 2015-07-16 ENCOUNTER — Ambulatory Visit (INDEPENDENT_AMBULATORY_CARE_PROVIDER_SITE_OTHER): Payer: Federal, State, Local not specified - PPO | Admitting: Emergency Medicine

## 2015-07-16 VITALS — BP 120/80 | HR 75 | Temp 97.4°F | Resp 16 | Ht 64.0 in | Wt 145.0 lb

## 2015-07-16 DIAGNOSIS — M7711 Lateral epicondylitis, right elbow: Secondary | ICD-10-CM

## 2015-07-16 DIAGNOSIS — Z130 Encounter for screening for diseases of the blood and blood-forming organs and certain disorders involving the immune mechanism: Secondary | ICD-10-CM | POA: Diagnosis not present

## 2015-07-16 DIAGNOSIS — Z0000 Encounter for general adult medical examination without abnormal findings: Secondary | ICD-10-CM

## 2015-07-16 DIAGNOSIS — Z131 Encounter for screening for diabetes mellitus: Secondary | ICD-10-CM

## 2015-07-16 DIAGNOSIS — Z13228 Encounter for screening for other metabolic disorders: Secondary | ICD-10-CM | POA: Diagnosis not present

## 2015-07-16 DIAGNOSIS — Z1329 Encounter for screening for other suspected endocrine disorder: Secondary | ICD-10-CM

## 2015-07-16 DIAGNOSIS — Z13220 Encounter for screening for lipoid disorders: Secondary | ICD-10-CM | POA: Diagnosis not present

## 2015-07-16 LAB — POCT CBC
Granulocyte percent: 68.4 %G (ref 37–80)
HCT, POC: 38.1 % (ref 37.7–47.9)
Hemoglobin: 11.8 g/dL — AB (ref 12.2–16.2)
Lymph, poc: 3 (ref 0.6–3.4)
MCH: 29.3 pg (ref 27–31.2)
MCHC: 31.1 g/dL — AB (ref 31.8–35.4)
MCV: 94.2 fL (ref 80–97)
MID (cbc): 0.6 (ref 0–0.9)
MPV: 6.4 fL (ref 0–99.8)
POC Granulocyte: 7.8 — AB (ref 2–6.9)
POC LYMPH %: 26.2 % (ref 10–50)
POC MID %: 5.4 % (ref 0–12)
Platelet Count, POC: 393 10*3/uL (ref 142–424)
RBC: 4.05 M/uL (ref 4.04–5.48)
RDW, POC: 14.1 %
WBC: 11.4 10*3/uL — AB (ref 4.6–10.2)

## 2015-07-16 NOTE — Progress Notes (Signed)
[facility] [address] [place] [address] [phone]  Date:  07/16/2015   Name:  Jordan Carrillo   DOB:  [birth year]   MRN:  [id number]  PCP:  Margaree Mackintosh, MD    History of Present Illness:  Jordan Carrillo is a 54 y.o. female patient who present to [facility] an annual physical exam.  She has multiple concerns today... Patient states that she has pain through her "whole entire body".  She hs had back pain, and recently "dislocated pelvic bone".  She was seen here at [facility] for this 6 days ago, and it where dxd chronic arthralgias of hip and knees.  She states the flexeril did not work, and she did not take the prednisone because of "the way it makes me feel".  She states the pain has resolved after she went to a chiropractor which helped her alignment.   She reports that some days she feels fine, but then can have horrible pains in her back.  She has headaches which approach intermittently.  There is some lightheadedness involved and nausea.  She has no vision changes, or auras.  They will come randomly.  She thought it may be due to milk, but she attempted to stop eating most foods, and would have the headache.  She links them to her stressors at work more adamently.  She states that it is very overwhelming and strenuous.  She works at [employer].  She states that they "push my buttons", however specifics leads to suggesting that they tell her she takes time off too much, or addressing when she does not show up, when it is due to her pains.   Social: Patient has no social relationships but her dog.  She talks to her dog like a person, and feels that the dog is at times talking back soothingly.  Family lives in [place] but is a strained relationship.  Has not talked to her daughter, who moved there, in about a year.  She believes it is because she is siding with patient's sister, and wants to stay in [place]....  She has no social relationships, including at work.  Hard to keep  relatonships because they turn on her.  Worked at an [employer] into several inter-transfers due to conflicts with co-workers.  She states that on her third transfer, "I felt better.Marland KitchenMarland KitchenStarted healing from it".     She states that she wishes she had more friends, and is open, "but they hurt me".  No romantic relationships, though was seeing a female in [place], but discontinued, because "I was not a priority".  No SI/HI.  Takes sertraline, but may miss dose about twice per week, she reports.  Takes 1 tablet per day.   Has not visited [place] in about a year, due to panic of long distance driving.    Per annual physical exam... Diet: Barel Eggplant and rice for 6 weeks, because she has digestive issues.  Makes food with coconut milk.  She eats 2 times per day.  "Barely eats nothing"  BM: Good.  Daily.  No constipation.  No diarrhea.    Urine: Normal.  No dysuria, hematuria, or abnormal frequency.    Exercise: "I can't."  "I want to lay down on something warm all the time".  Afraid the pain will get worse so she does not want to exercise.  Feet hurt so bad she can not walk.  "Feels like I'm in limbo."  Patient was seen recently by  neurology, who dxd her with fibromyalgia.  Stress on the job, post office.  They are pushing my buttons.    Normal pap exam performed 1 year ago.       Patient Active Problem List   Diagnosis Date Noted  . Plantar fasciitis of right foot 06/15/2014  . Metatarsal deformity 06/15/2014  . History of migraine headaches 07/31/2013  . Depression 09/08/2011  . Bilateral carpal tunnel syndrome 09/08/2011  . Persistent headaches 09/08/2011  . Chondromalacia of right knee 09/08/2011  . Tendinosis 09/08/2011  . Fibromyalgia syndrome 09/08/2011  . Cervical radiculopathy 09/08/2011  . Lumbar radiculopathy 09/08/2011  . CROHN'S DISEASE 12/28/2009  . RECTAL BLEEDING 10/28/2009  . INTERNAL HEMORRHOIDS WITHOUT MENTION COMP 10/22/2008  . ESOPHAGEAL REFLUX 10/22/2008     Past Medical History  Diagnosis Date  . Hyperlipidemia   . Cervical radiculopathy   . Lumbar radiculopathy   . Bilateral carpal tunnel syndrome   . Migraine   . Ulcer     History reviewed. No pertinent past surgical history.  Social History  Substance Use Topics  . Smoking status: Never Smoker   . Smokeless tobacco: Never Used  . Alcohol Use: No    Family History  Problem Relation Age of Onset  . Cancer Mother   . Cancer Father     Allergies  Allergen Reactions  . Latex Other (See Comments)    Medication list has been reviewed and updated.  Current Outpatient Prescriptions on File Prior to Visit  Medication Sig Dispense Refill  . cyclobenzaprine (FLEXERIL) 5 MG tablet Take 1 tablet (5 mg total) by mouth 3 (three) times daily as needed for muscle spasms. 30 tablet 0  . predniSONE (DELTASONE) 20 MG tablet Two daily with food 10 tablet 0  . sertraline (ZOLOFT) 50 MG tablet Take one half to one tablet daily 30 tablet 5  . nabumetone (RELAFEN) 500 MG tablet Take 1 tablet (500 mg total) by mouth 2 (two) times daily. (Patient not taking: Reported on 07/11/2015) 60 tablet 1  . Vitamin D, Ergocalciferol, (DRISDOL) 50000 UNITS CAPS capsule Take 50,000 Units by mouth once a week.  0   Current Facility-Administered Medications on File Prior to Visit  Medication Dose Route Frequency Provider Last Rate Last Dose  . TDaP (BOOSTRIX) injection 0.5 mL  0.5 mL Intramuscular Once Margaree Mackintosh, MD        Review of Systems  Constitutional: Negative for fever and chills.  Eyes: Negative for blurred vision.  Respiratory: Negative for cough, shortness of breath and wheezing.   Cardiovascular: Negative for chest pain, palpitations and leg swelling.  Gastrointestinal: Negative for nausea, vomiting, diarrhea, constipation and blood in stool.  Genitourinary: Negative for dysuria, frequency and hematuria.  Musculoskeletal: Positive for joint pain and neck pain.  Psychiatric/Behavioral:  Negative for suicidal ideas. The patient is nervous/anxious.      Physical Examination: BP 120/80 mmHg  Pulse 75  Temp(Src) 97.4 F (36.3 C) (Oral)  Resp 16  Ht 5\' 4"  (1.626 m)  Wt 145 lb (65.772 kg)  BMI 24.88 kg/m2  SpO2 99%  LMP 06/13/2015 Ideal Body Weight: Weight in (lb) to have BMI = 25: 145.3  Physical Exam  Constitutional: She is oriented to person, place, and time. She appears well-developed and well-nourished.  HENT:  Head: Normocephalic and atraumatic.  Eyes: Conjunctivae and EOM are normal. Pupils are equal, round, and reactive to light. Right eye exhibits no discharge. Left eye exhibits no discharge. No scleral icterus.  Neck: Full  passive range of motion without pain.  Thyroid full but no mass.  Cardiovascular: Normal rate and regular rhythm.  Exam reveals no gallop, no distant heart sounds and no friction rub.   No murmur heard. Pulses:      Radial pulses are 2+ on the right side, and 2+ on the left side.       Dorsalis pedis pulses are 2+ on the right side, and 2+ on the left side.  Pulmonary/Chest: Effort normal and breath sounds normal. No respiratory distress. She has no wheezes.  Abdominal: Soft. Bowel sounds are normal. She exhibits no distension and no mass. There is no tenderness.  Musculoskeletal: Normal range of motion. She exhibits no edema or tenderness.       Cervical back: She exhibits normal range of motion.       Thoracic back: She exhibits normal range of motion.       Lumbar back: She exhibits normal range of motion.  Normal strength throughout extremities.   Mild tenderness along lateral epicondyle, with tenderness to resisted wrist extension, and supination.  Lymphadenopathy:    She has no cervical adenopathy.  Neurological: She is alert and oriented to person, place, and time. She displays normal reflexes.  Skin: Skin is warm and dry.  Psychiatric: Her speech is normal and behavior is normal. Her affect is labile.     Assessment and  Plan: 54 year old female is here today for annual physical exam. -She will schedule mammogram, declines referral at this time. -Declines flu vaccine at this time. -I have verbally discussed needing a therapist, and referring to Vernell Leep.  I have also advised that I will refer to her a woman if she prefers.  She needs a formal dx. which is more advantageous in proceeding with care and best management practices.  Encouraged therapy as well.  She declines psych referral at this time.   -Advised to increase exercise, and recommended aquatic exercise as well.  Patient agrees.  -Advised continuous tennis elbow brace, and stretches, with ice.     Annual physical exam - Plan: POCT CBC, COMPLETE METABOLIC PANEL WITH GFR, TSH, Lipid panel, Hemoglobin A1c  Screening for deficiency anemia - Plan: POCT CBC  Screening for metabolic disorder - Plan: COMPLETE METABOLIC PANEL WITH GFR, TSH  Screening for thyroid disorder  Screening for lipid disorders - Plan: Lipid panel  Screening for diabetes mellitus - Plan: Hemoglobin A1c   Trena Platt, PA-C [facility] 07/16/2015 1:37 PM

## 2015-07-16 NOTE — Patient Instructions (Addendum)
Please schedule your mammogram. We really want you to meet with a therapist at this time.  If you would like me to schedule a referral to Vernell Leep, you can contact me or Dr. Cleta Alberts, at any time.  If you would prefer a women, that would be fine too. As you have suggested, a lot of your symptoms are coming from stressors, and our mental health is just as important, as the rest of the body below.     I will have your results in the next 10-14 days.

## 2015-07-17 LAB — COMPLETE METABOLIC PANEL WITH GFR
ALT: 27 U/L (ref 6–29)
AST: 26 U/L (ref 10–35)
Albumin: 3.9 g/dL (ref 3.6–5.1)
Alkaline Phosphatase: 70 U/L (ref 33–130)
BUN: 13 mg/dL (ref 7–25)
CO2: 26 mmol/L (ref 20–31)
Calcium: 8.9 mg/dL (ref 8.6–10.4)
Chloride: 99 mmol/L (ref 98–110)
Creat: 0.69 mg/dL (ref 0.50–1.05)
GFR, Est African American: >89 mL/min (ref >=60)
GFR, Est Non African American: >89 mL/min (ref >=60)
GLUCOSE: 87 mg/dL (ref 65–99)
POTASSIUM: 4.1 mmol/L (ref 3.5–5.3)
Sodium: 136 mmol/L (ref 135–146)
Total Bilirubin: 0.4 mg/dL (ref 0.2–1.2)
Total Protein: 7.1 g/dL (ref 6.1–8.1)

## 2015-07-17 LAB — LIPID PANEL
CHOL/HDL RATIO: 5.2 ratio — AB (ref <=5.0)
CHOLESTEROL: 285 mg/dL — AB (ref 125–200)
HDL: 55 mg/dL (ref >=46)
LDL Cholesterol: 203 mg/dL — ABNORMAL HIGH (ref <130)
TRIGLYCERIDES: 136 mg/dL (ref <150)
VLDL: 27 mg/dL (ref <30)

## 2015-07-17 LAB — TSH: TSH: 1.162 u[IU]/mL (ref 0.350–4.500)

## 2015-07-18 LAB — HEMOGLOBIN A1C
Hgb A1c MFr Bld: 4.9 % (ref <5.7)
MEAN PLASMA GLUCOSE: 94 mg/dL (ref <117)

## 2015-07-19 ENCOUNTER — Encounter: Payer: Self-pay | Admitting: Family Medicine

## 2016-02-14 ENCOUNTER — Encounter (HOSPITAL_COMMUNITY): Payer: Self-pay

## 2016-02-14 ENCOUNTER — Ambulatory Visit (INDEPENDENT_AMBULATORY_CARE_PROVIDER_SITE_OTHER): Payer: Federal, State, Local not specified - PPO | Admitting: Family Medicine

## 2016-02-14 ENCOUNTER — Emergency Department (HOSPITAL_COMMUNITY)
Admission: EM | Admit: 2016-02-14 | Discharge: 2016-02-14 | Disposition: A | Discharge status: Home / Self Care | Payer: Federal, State, Local not specified - PPO | Attending: Emergency Medicine | Admitting: Emergency Medicine

## 2016-02-14 ENCOUNTER — Ambulatory Visit: Payer: Self-pay

## 2016-02-14 VITALS — BP 123/82 | HR 99 | Temp 98.6°F | Resp 16 | Ht 64.0 in | Wt 145.4 lb

## 2016-02-14 DIAGNOSIS — R112 Nausea with vomiting, unspecified: Secondary | ICD-10-CM | POA: Diagnosis not present

## 2016-02-14 DIAGNOSIS — R1110 Vomiting, unspecified: Secondary | ICD-10-CM | POA: Insufficient documentation

## 2016-02-14 DIAGNOSIS — R51 Headache: Secondary | ICD-10-CM

## 2016-02-14 DIAGNOSIS — R202 Paresthesia of skin: Secondary | ICD-10-CM | POA: Diagnosis not present

## 2016-02-14 DIAGNOSIS — R1310 Dysphagia, unspecified: Secondary | ICD-10-CM

## 2016-02-14 DIAGNOSIS — R200 Anesthesia of skin: Secondary | ICD-10-CM

## 2016-02-14 DIAGNOSIS — R519 Headache, unspecified: Secondary | ICD-10-CM

## 2016-02-14 NOTE — Patient Instructions (Addendum)
     IF you received an x-ray today, you will receive an invoice from [facility]. Please contact [facility] at [phone] with questions or concerns regarding your invoice.   IF you received labwork today, you will receive an invoice from [employer]. Please contact [facility] at [phone] with questions or concerns regarding your invoice.   Our billing staff will not be able to assist you with questions regarding bills from these companies.  You will be contacted with the lab results as soon as they are available. The fastest way to get your results is to activate your My Chart account. Instructions are located on the last page of this paperwork. If you have not heard from [place] regarding the results in 2 weeks, please contact this office.     Because of your symptoms of vomiting and transient hand numbness and difficulty swallowing this morning - associated with your headache, I recommended further evaluation through [facility] emergency room tonight. See printed instructions on how to get there. I did advise the nursing staff you were on the way.  If you feel your symptoms are worsening, or unable to drive, pull over immediately and call 911.

## 2016-02-14 NOTE — Progress Notes (Signed)
Subjective:  By signing my name below, I, Stann Ore, attest that this documentation has been prepared under the direction and in the presence of Meredith Staggers, MD. Electronically Signed: Stann Ore, Scribe. 02/14/2016 , 5:40 PM .  Patient was seen in Room 8 .   Patient ID: Jordan Carrillo, female    DOB: 1961-05-19, 55 y.o.   MRN: [id number] Chief Complaint  Patient presents with  . Emesis    x sat.  . head is throbbing   HPI Jordan Carrillo is a 55 y.o. female Here with headache and vomiting that started 3 days ago. She has history of chronic pain syndrome and depression. She was recommended a therapist at last visit. She does take zoloft. She saw Trena Platt, PA-C in Sept 2016. History of headaches noted at that time. History of migraine and headaches per problems list. She's seen Dr. Anne Hahn before due to limb pain in 2015, although looks like nerve conduction study only.    Patient states that her headache started with vomiting at 4:00AM 2 days ago. She notes this headache is worst than some of previous ones, but "not worst one in life". She was able to go to work yesterday but was able to sleep. She woke up again due to the headache at 4:00AM this morning with dry heaving. She was able to go back to sleep, and woke up again at 10:00AM. Her headache continued at that time, causing her to vomit, inability to swallow and right hand numbness. She informs 5 episodes of vomiting. She reports her inability to swallow lasted about 30 minutes, but her right hand numbness persists throughout the day. She's taken advil 2 on Sunday morning, 4 yesterday and 3 this morning. She's unsure if her right arm was weak. She's had similar pain with her chronic pain syndrome. She denies history of aneurysm. She denies slurred speech, changes to vision, or diarrhea. She reports wearing a neck to brace to help with her headaches; denies any relief.   She was seen at [facility]  for other treatment.   She saw the therapist once. She has stopped taking her zoloft 4 months ago because she was feeling generally better. Her quality of life was starting to improve at that time. She did mention having some troubles with her daughter, but she has been dealing to resolve it herself.   She works at [employer].   Patient Active Problem List   Diagnosis Date Noted  . Plantar fasciitis of right foot 06/15/2014  . Metatarsal deformity 06/15/2014  . History of migraine headaches 07/31/2013  . Depression 09/08/2011  . Bilateral carpal tunnel syndrome 09/08/2011  . Persistent headaches 09/08/2011  . Chondromalacia of right knee 09/08/2011  . Tendinosis 09/08/2011  . Fibromyalgia syndrome 09/08/2011  . Cervical radiculopathy 09/08/2011  . Lumbar radiculopathy 09/08/2011  . CROHN'S DISEASE 12/28/2009  . RECTAL BLEEDING 10/28/2009  . INTERNAL HEMORRHOIDS WITHOUT MENTION COMP 10/22/2008  . ESOPHAGEAL REFLUX 10/22/2008   Past Medical History  Diagnosis Date  . Hyperlipidemia   . Cervical radiculopathy   . Lumbar radiculopathy   . Bilateral carpal tunnel syndrome   . Migraine   . Ulcer    History reviewed. No pertinent past surgical history. Allergies  Allergen Reactions  . Latex Other (See Comments)   Prior to Admission medications   Medication Sig Start Date End Date Taking? Authorizing Provider  cyclobenzaprine (FLEXERIL) 5 MG tablet Take 1 tablet (5 mg total) by mouth 3 (three)  times daily as needed for muscle spasms. Patient not taking: Reported on 02/14/2016 07/11/15   Elvina Sidle, MD  nabumetone (RELAFEN) 500 MG tablet Take 1 tablet (500 mg total) by mouth 2 (two) times daily. Patient not taking: Reported on 07/11/2015 06/15/14   Myeong O Sheard, DPM  sertraline (ZOLOFT) 50 MG tablet Take one half to one tablet daily Patient not taking: Reported on 02/14/2016 03/19/15   Collene Gobble, MD  Vitamin D, Ergocalciferol, (DRISDOL) 50000 UNITS CAPS capsule Take 50,000  Units by mouth once a week. Reported on 02/14/2016 12/30/14   Historical Provider, MD   Social History   Social History  . Marital Status: Single    Spouse Name: N/A  . Number of Children: N/A  . Years of Education: N/A   Occupational History  . Not on file.   Social History Main Topics  . Smoking status: Never Smoker   . Smokeless tobacco: Never Used  . Alcohol Use: No  . Drug Use: No  . Sexual Activity: No   Other Topics Concern  . Not on file   Social History Narrative   Review of Systems  Eyes: Negative for visual disturbance.  Gastrointestinal: Positive for nausea and vomiting. Negative for abdominal pain and diarrhea.  Skin: Negative for rash and wound.  Neurological: Positive for numbness and headaches. Negative for dizziness, speech difficulty, weakness and light-headedness.  Psychiatric/Behavioral: Positive for dysphoric mood. The patient is nervous/anxious.       Objective:   Physical Exam  Constitutional: She is oriented to person, place, and time. She appears well-developed and well-nourished. No distress.  HENT:  Head: Normocephalic and atraumatic.  Mouth/Throat: Mucous membranes are normal.  equal palate elevation  Eyes: EOM are normal. Pupils are equal, round, and reactive to light. Right eye exhibits no nystagmus. Left eye exhibits no nystagmus.  Neck: Neck supple.  Cardiovascular: Normal rate.   Pulmonary/Chest: Effort normal. No respiratory distress.  Musculoskeletal: Normal range of motion.  Grip subjectively equal  Neurological: She is alert and oriented to person, place, and time.  no pronator drift, equal upper and lower extremities strength; Heel-toe normal, finger to nose normal  Skin: Skin is warm and dry.  Psychiatric: She has a normal mood and affect. Her behavior is normal.  Nursing note and vitals reviewed.   Filed Vitals:   02/14/16 1615  BP: 123/82  Pulse: 99  Temp: 98.6 F (37 C)  TempSrc: Oral  Resp: 16  Height: 5\' 4"  (1.626  m)  Weight: 145 lb 6.4 oz (65.953 kg)  SpO2: 97%      Assessment & Plan:   Jordan Carrillo is a 55 y.o. female Throbbing headache  Non-intractable vomiting with nausea, vomiting of unspecified type  Numbness and tingling in right hand  Difficulty swallowing  Onset of acute throbbing, bilateral headache at 4 AM 2 days ago, associated with vomiting today, and 30 minute episode of difficulty swallowing and right hand numbness this morning. Somewhat difficult history whether there was associated weakness in the right arm but appears to be just numbness in the hand.  - Nonfocal neuro exam at present, but with severity of headache worse than previous migraines, and associated neurologic symptoms, will have further evaluation through [facility] emergency room.  - Discussed EMS transport, but as nonfocal exam here and appears to be in no acute distress, chose to drive herself by private vehicle. Triage advised at [facility] ER.   No orders of the defined types were placed in  this encounter.   Patient Instructions       IF you received an x-ray today, you will receive an invoice from [facility]. Please contact [facility] at [phone] with questions or concerns regarding your invoice.   IF you received labwork today, you will receive an invoice from [employer]. Please contact [facility] at [phone] with questions or concerns regarding your invoice.   Our billing staff will not be able to assist you with questions regarding bills from these companies.  You will be contacted with the lab results as soon as they are available. The fastest way to get your results is to activate your My Chart account. Instructions are located on the last page of this paperwork. If you have not heard from [place] regarding the results in 2 weeks, please contact this office.     Because of your symptoms of vomiting and transient hand numbness and difficulty  swallowing this morning - associated with your headache, I recommended further evaluation through [facility] emergency room tonight. See printed instructions on how to get there. I did advise the nursing staff you were on the way.  If you feel your symptoms are worsening, or unable to drive, pull over immediately and call 911.  I personally performed the services described in this documentation, which was scribed in my presence. The recorded information has been reviewed and considered, and addended by me as needed.

## 2016-02-14 NOTE — ED Notes (Signed)
Onset 02-11-16 @ 4am pt woke up to throbbing headache and vomited.  Throbbing pain is worse around 3-4am during night and mild pain during day.  Pt vomited x 5 today.  H/o migraines.  Onset 9am today pt reports right hand went numb for 30 minutes.  No other s/s noted.  Has been taking Advil with little pain relief.  Sent here from u/c.

## 2016-02-15 ENCOUNTER — Other Ambulatory Visit: Payer: Self-pay | Admitting: Specialist

## 2016-02-15 DIAGNOSIS — R519 Headache, unspecified: Secondary | ICD-10-CM

## 2016-02-15 DIAGNOSIS — R270 Ataxia, unspecified: Secondary | ICD-10-CM

## 2016-02-15 DIAGNOSIS — R51 Headache: Principal | ICD-10-CM

## 2016-02-19 ENCOUNTER — Ambulatory Visit
Admission: RE | Admit: 2016-02-19 | Discharge: 2016-02-19 | Disposition: A | Discharge status: Home / Self Care | Payer: Federal, State, Local not specified - PPO | Source: Ambulatory Visit | Attending: Specialist | Admitting: Specialist

## 2016-02-19 DIAGNOSIS — R270 Ataxia, unspecified: Secondary | ICD-10-CM

## 2016-02-19 DIAGNOSIS — R51 Headache: Principal | ICD-10-CM

## 2016-02-19 DIAGNOSIS — R519 Headache, unspecified: Secondary | ICD-10-CM

## 2016-03-05 ENCOUNTER — Encounter: Payer: Self-pay | Admitting: Gastroenterology

## 2016-04-25 DIAGNOSIS — S4992XA Unspecified injury of left shoulder and upper arm, initial encounter: Secondary | ICD-10-CM | POA: Diagnosis not present

## 2016-04-25 DIAGNOSIS — M25512 Pain in left shoulder: Secondary | ICD-10-CM | POA: Diagnosis not present

## 2016-05-05 ENCOUNTER — Encounter (HOSPITAL_BASED_OUTPATIENT_CLINIC_OR_DEPARTMENT_OTHER): Payer: Self-pay | Admitting: *Deleted

## 2016-05-05 ENCOUNTER — Emergency Department (HOSPITAL_BASED_OUTPATIENT_CLINIC_OR_DEPARTMENT_OTHER)
Admission: EM | Admit: 2016-05-05 | Discharge: 2016-05-05 | Disposition: A | Discharge status: Home / Self Care | Attending: Emergency Medicine | Admitting: Emergency Medicine

## 2016-05-05 ENCOUNTER — Emergency Department (HOSPITAL_BASED_OUTPATIENT_CLINIC_OR_DEPARTMENT_OTHER)

## 2016-05-05 DIAGNOSIS — Y93F2 Activity, caregiving, lifting: Secondary | ICD-10-CM | POA: Insufficient documentation

## 2016-05-05 DIAGNOSIS — S4992XA Unspecified injury of left shoulder and upper arm, initial encounter: Secondary | ICD-10-CM

## 2016-05-05 DIAGNOSIS — X500XXA Overexertion from strenuous movement or load, initial encounter: Secondary | ICD-10-CM | POA: Diagnosis not present

## 2016-05-05 DIAGNOSIS — E785 Hyperlipidemia, unspecified: Secondary | ICD-10-CM | POA: Diagnosis not present

## 2016-05-05 DIAGNOSIS — Z79899 Other long term (current) drug therapy: Secondary | ICD-10-CM | POA: Diagnosis not present

## 2016-05-05 DIAGNOSIS — Y990 Civilian activity done for income or pay: Secondary | ICD-10-CM | POA: Insufficient documentation

## 2016-05-05 DIAGNOSIS — S29011A Strain of muscle and tendon of front wall of thorax, initial encounter: Secondary | ICD-10-CM | POA: Insufficient documentation

## 2016-05-05 DIAGNOSIS — Y929 Unspecified place or not applicable: Secondary | ICD-10-CM | POA: Diagnosis not present

## 2016-05-05 MED ORDER — IBUPROFEN 800 MG PO TABS: 800.0000 mg | ORAL_TABLET | Freq: Three times a day (TID) | ORAL | Status: DC

## 2016-05-05 MED ORDER — HYDROCODONE-ACETAMINOPHEN 5-325 MG PO TABS
1.0000 | ORAL_TABLET | Freq: Once | ORAL | Status: AC
  Administered 2016-05-05: 1 via ORAL
  Filled 2016-05-05: qty 1

## 2016-05-05 NOTE — ED Provider Notes (Signed)
CSN: [id number]     Arrival date & time 05/05/16  1808 History  By signing my name below, I, Jordan Carrillo, attest that this documentation has been prepared under the direction and in the presence of Glynn Octave, MD . Electronically Signed: Marisue Carrillo, Scribe. 05/05/2016. 8:59 PM.   Chief Complaint  Patient presents with  . Arm Injury    The history is provided by the patient. No language interpreter was used.   HPI Comments:  Jordan Carrillo is a 55 y.o. female with PMHx of cervical radiculopathy, lumbar radiculopathy, BL carpal tunnel syndrome and migraines who presents to the Emergency Department complaining of worsening front left shoulder pain onset yesterday, worse with certain movements of joint. She states she was trying to lift a large restroom divider up at work yesterday; she felt a snap and shooting pain in the front of her left shoulder. Pt took 4 Advil ~12 hours ago without relief; she has also worn a pain patch without relief. Pt is right handed. Pt reports associated headache. Denies weakness, chest pain, or difficulty breathing.  PCP: [facility]  Past Medical History  Diagnosis Date  . Hyperlipidemia   . Cervical radiculopathy   . Lumbar radiculopathy   . Bilateral carpal tunnel syndrome   . Migraine   . Ulcer    History reviewed. No pertinent past surgical history. Family History  Problem Relation Age of Onset  . Cancer Mother   . Cancer Father    Social History  Substance Use Topics  . Smoking status: Never Smoker   . Smokeless tobacco: Never Used  . Alcohol Use: No   OB History    No data available     Review of Systems 10 systems reviewed and all are negative for acute change except as noted in the HPI.   Allergies  Latex  Home Medications   Prior to Admission medications   Medication Sig Start Date End Date Taking? Authorizing Provider  sertraline (ZOLOFT) 50 MG tablet Take one half to one tablet daily 03/19/15  Yes  Collene Gobble, MD  Vitamin D, Ergocalciferol, (DRISDOL) 50000 UNITS CAPS capsule Take 50,000 Units by mouth once a week. Reported on 02/14/2016 12/30/14  Yes Historical Provider, MD  ibuprofen (ADVIL,MOTRIN) 800 MG tablet Take 1 tablet (800 mg total) by mouth 3 (three) times daily. 05/05/16   Glynn Octave, MD   BP 128/80 mmHg  Pulse 78  Temp(Src) 98.4 F (36.9 C) (Oral)  Resp 18  Ht 5\' 4"  (1.626 m)  Wt 140 lb (63.504 kg)  BMI 24.02 kg/m2  SpO2 100%  LMP 01/18/2016   Physical Exam  Constitutional: She is oriented to person, place, and time. She appears well-developed and well-nourished. No distress.  HENT:  Head: Normocephalic and atraumatic.  Mouth/Throat: Oropharynx is clear and moist. No oropharyngeal exudate.  Eyes: Conjunctivae and EOM are normal. Pupils are equal, round, and reactive to light.  Neck: Normal range of motion. Neck supple.  No meningismus.  Cardiovascular: Normal rate, regular rhythm, normal heart sounds and intact distal pulses.   No murmur heard. Pulmonary/Chest: Effort normal and breath sounds normal. No respiratory distress.  Abdominal: Soft. There is no tenderness. There is no rebound and no guarding.  Musculoskeletal: Normal range of motion. She exhibits no edema or tenderness.  TTP to left anterior shoulder and chest wall; able to lift arm to 90 degrees as well as above head; intact radial pulse good strength; axiallary nerve sensation intact  Neurological:  She is alert and oriented to person, place, and time. No cranial nerve deficit. She exhibits normal muscle tone. Coordination normal.  No ataxia on finger to nose bilaterally. No pronator drift. 5/5 strength throughout. CN 2-12 intact.Equal grip strength. Sensation intact.   Skin: Skin is warm.  Psychiatric: She has a normal mood and affect. Her behavior is normal.  Nursing note and vitals reviewed.  ED Course  Procedures   DIAGNOSTIC STUDIES: Oxygen Saturation is 100% on RA, normal by my  interpretation.    COORDINATION OF CARE: 8:54 PM Will x-ray left shoulder. Discussed treatment plan with pt at bedside and pt agreed to plan.  Labs Review Labs Reviewed - No data to display  Imaging Review Dg Shoulder Left  05/05/2016  CLINICAL DATA:  55 year old female with left shoulder pain EXAM: LEFT SHOULDER - 2+ VIEW COMPARISON:  Chest radiograph dated 07/28/2013 FINDINGS: There is no evidence of fracture or dislocation. There is no evidence of arthropathy or other focal bone abnormality. Soft tissues are unremarkable. IMPRESSION: Negative. Electronically Signed   By: Elgie Collard M.D.   On: 05/05/2016 22:02   I have personally reviewed and evaluated these images and lab results as part of my medical decision-making.   EKG Interpretation None      MDM   Final diagnoses:  Shoulder injury, left, initial encounter  Strain of left pectoralis muscle, initial encounter   Patient fell pop in left arm and shoulder after moving divider last night. Denies any difficulty breathing or chest pain or difficulty swallowing.  Patient able to range her shoulder above her head without difficulty.  Suspect pectoralis muscle tear versus rotator cuff tear. No neurological deficits.  Xray negative.  Follow up with sports medicine clinic and worker's comp doctor.  Sling, NSAIDs, RICE.   I personally performed the services described in this documentation, which was scribed in my presence. The recorded information has been reviewed and is accurate.    Glynn Octave, MD 05/06/16 [phone]

## 2016-05-05 NOTE — ED Notes (Signed)
Pt has [place] [employer] forms she was told would need to be filled out by her provider in the ED. Dr. Manus Gunning states he will not fill out these papers, that they need to be filled out by the [facility] Comp provider contracted by her employer. I spoke to pt's supervisor on the phone and explained this, and he does not agree. I informed him I would send the pt with a work note stating she was seen and treated in our facility with a date and the provider's name. I gave him our facility's phone number and instructed that he may call on Monday to speak to the director of the ED at that time. Charge RN, Cletis Athens notified of situation.

## 2016-05-05 NOTE — ED Notes (Signed)
Per pt report work related injury of lt arm, was lifting object (divider) and felt tear/pull to lt arm.

## 2016-05-05 NOTE — Discharge Instructions (Signed)
Muscle Strain You may have torn your rotator cuff or chest wall muscle. Follow up sports medicine or your worker's comp doctor. Return to the ED if you develop new or worsening symptoms. A muscle strain is an injury that occurs when a muscle is stretched beyond its normal length. Usually a small number of muscle fibers are torn when this happens. Muscle strain is rated in degrees. First-degree strains have the least amount of muscle fiber tearing and pain. Second-degree and third-degree strains have increasingly more tearing and pain.  Usually, recovery from muscle strain takes 1-2 weeks. Complete healing takes 5-6 weeks.  CAUSES  Muscle strain happens when a sudden, violent force placed on a muscle stretches it too far. This may occur with lifting, sports, or a fall.  RISK FACTORS Muscle strain is especially common in athletes.  SIGNS AND SYMPTOMS At the site of the muscle strain, there may be:  Pain.  Bruising.  Swelling.  Difficulty using the muscle due to pain or lack of normal function. DIAGNOSIS  Your health care provider will perform a physical exam and ask about your medical history. TREATMENT  Often, the best treatment for a muscle strain is resting, icing, and applying cold compresses to the injured area.  HOME CARE INSTRUCTIONS   Use the PRICE method of treatment to promote muscle healing during the first 2-3 days after your injury. The PRICE method involves:  Protecting the muscle from being injured again.  Restricting your activity and resting the injured body part.  Icing your injury. To do this, put ice in a plastic bag. Place a towel between your skin and the bag. Then, apply the ice and leave it on from 15-20 minutes each hour. After the third day, switch to moist heat packs.  Apply compression to the injured area with a splint or elastic bandage. Be careful not to wrap it too tightly. This may interfere with blood circulation or increase swelling.  Elevate the  injured body part above the level of your heart as often as you can.  Only take over-the-counter or prescription medicines for pain, discomfort, or fever as directed by your health care provider.  Warming up prior to exercise helps to prevent future muscle strains. SEEK MEDICAL CARE IF:   You have increasing pain or swelling in the injured area.  You have numbness, tingling, or a significant loss of strength in the injured area. MAKE SURE YOU:   Understand these instructions.  Will watch your condition.  Will get help right away if you are not doing well or get worse.   This information is not intended to replace advice given to you by your health care provider. Make sure you discuss any questions you have with your health care provider.   Document Released: 10/29/2005 Document Revised: 08/19/2013 Document Reviewed: 05/28/2013 Elsevier Interactive Patient Education [employer]

## 2016-05-09 ENCOUNTER — Ambulatory Visit (INDEPENDENT_AMBULATORY_CARE_PROVIDER_SITE_OTHER): Payer: Worker's Compensation | Admitting: Family Medicine

## 2016-05-09 ENCOUNTER — Encounter: Payer: Self-pay | Admitting: Family Medicine

## 2016-05-09 VITALS — BP 116/88 | HR 78 | Ht 64.0 in | Wt 140.0 lb

## 2016-05-09 DIAGNOSIS — S4992XA Unspecified injury of left shoulder and upper arm, initial encounter: Secondary | ICD-10-CM

## 2016-05-09 MED ORDER — DICLOFENAC SODIUM 75 MG PO TBEC: 75.0000 mg | DELAYED_RELEASE_TABLET | Freq: Two times a day (BID) | ORAL | Status: DC

## 2016-05-09 MED FILL — DICLOFENAC SOD EC 75 MG TAB: 75 | 30 days supply | Qty: 60 | Fill #0

## 2016-05-09 NOTE — Patient Instructions (Signed)
You have a partial thickness rotator cuff tear (supraspinatus muscle). Use sling as needed for next 2 weeks. Voltaren  twice a day with food for pain and inflammation. Start physical therapy and do home exercises on days you don't go to therapy. Follow up with me in 2 weeks. Light duty with restrictions noted in the letter until I see you back. Icing 15 minutes at a time 3-4 times a day at least.

## 2016-05-10 DIAGNOSIS — S4992XA Unspecified injury of left shoulder and upper arm, initial encounter: Secondary | ICD-10-CM | POA: Insufficient documentation

## 2016-05-10 MED FILL — IBUPROFEN 800 MG TABLET: 800 | 7 days supply | Qty: 21 | Fill #0

## 2016-05-10 NOTE — Progress Notes (Signed)
PCP: Margaree Mackintosh, MD  Subjective:   HPI: Patient is a 55 y.o. female here for left shoulder injury.  Patient reports on 6/23 she was lifting a bathroom divider when she felt a pull deep anterior left sohulder. Felt some swelling here.  No bruising. No prior left shoulder injuries. Is right handed. Using a sling now, taking advil, using heat. Had some soreness when moving things around at work that day before this acute injury. Pain is 7/10, sharp. No skin changes, numbness.  Past Medical History  Diagnosis Date  . Hyperlipidemia   . Cervical radiculopathy   . Lumbar radiculopathy   . Bilateral carpal tunnel syndrome   . Migraine   . Ulcer     Current Outpatient Prescriptions on File Prior to Visit  Medication Sig Dispense Refill  . sertraline (ZOLOFT) 50 MG tablet Take one half to one tablet daily 30 tablet 5  . Vitamin D, Ergocalciferol, (DRISDOL) 50000 UNITS CAPS capsule Take 50,000 Units by mouth once a week. Reported on 02/14/2016  0   Current Facility-Administered Medications on File Prior to Visit  Medication Dose Route Frequency Provider Last Rate Last Dose  . TDaP (BOOSTRIX) injection 0.5 mL  0.5 mL Intramuscular Once Margaree Mackintosh, MD        No past surgical history on file.  Allergies  Allergen Reactions  . Latex Other (See Comments)    Social History   Social History  . Marital Status: Single    Spouse Name: N/A  . Number of Children: N/A  . Years of Education: N/A   Occupational History  . Not on file.   Social History Main Topics  . Smoking status: Never Smoker   . Smokeless tobacco: Never Used  . Alcohol Use: No  . Drug Use: No  . Sexual Activity: No   Other Topics Concern  . Not on file   Social History Narrative    Family History  Problem Relation Age of Onset  . Cancer Mother   . Cancer Father     BP 116/88 mmHg  Pulse 78  Ht 5\' 4"  (1.626 m)  Wt 140 lb (63.504 kg)  BMI 24.02 kg/m2  LMP 01/18/2016  Review of Systems: See  HPI above.    Objective:  Physical Exam:  Gen: NAD, comfortable in exam room  Left shoulder: No swelling, ecchymoses.  No gross deformity including of pec major. Mild diffuse tenderness.   Full ER, IR.  Abduction and flexion to 90 degrees, painful. Negative Hawkins, Neers. Negative Speeds, Yergasons. Strength 4/5 with empty can and 5-/5 resisted internal/external rotation.  Pain empty can. Negative apprehension. NV intact distally.  Right shoulder: FROM without pain.  MSK u/s Left shoulder:  Biceps tendon intact on trans and long views, no abnormalities.  AC joint with minimal arthropathy.  Subscapularis, infraspinatus normal without tears or impingement.  Very small partial insertional tear of supraspinatus but no retraction.  No bursitis.  Pec major appears intact out to insertion.    Assessment & Plan:  1. Left shoulder injury - consistent with partial supraspinatus tear.  Start with sling, physical therapy and home exercises.  Voltaren twice a day.  Light duty with restrictions.  Icing.  F/u in 2 weeks.

## 2016-05-10 NOTE — Assessment & Plan Note (Signed)
consistent with partial supraspinatus tear.  Start with sling, physical therapy and home exercises.  Voltaren twice a day.  Light duty with restrictions.  Icing.  F/u in 2 weeks.

## 2016-05-23 ENCOUNTER — Encounter: Payer: Self-pay | Admitting: Family Medicine

## 2016-05-23 ENCOUNTER — Ambulatory Visit (INDEPENDENT_AMBULATORY_CARE_PROVIDER_SITE_OTHER): Payer: Worker's Compensation | Admitting: Family Medicine

## 2016-05-23 VITALS — BP 119/75 | HR 96 | Ht 64.0 in | Wt 140.0 lb

## 2016-05-23 DIAGNOSIS — S4992XD Unspecified injury of left shoulder and upper arm, subsequent encounter: Secondary | ICD-10-CM | POA: Diagnosis not present

## 2016-05-23 NOTE — Patient Instructions (Signed)
You have a partial thickness rotator cuff tear (supraspinatus muscle). Try to stop using the sling now. Ibuprofen 600-800mg  three times a day with food. Start physical therapy and do home exercises on days you don't go to therapy. Follow up with me in 4 weeks. Light duty with restrictions noted in the letter until I see you back. Icing 15 minutes at a time 3-4 times a day at least. Your ultrasound is reassuring - there are no new findings and no retraction of muscle fibers. You should call your primary care physician as well as we discussed.

## 2016-05-28 NOTE — Progress Notes (Signed)
PCP: Margaree Mackintosh, MD  Subjective:   HPI: Patient is a 55 y.o. female here for left shoulder injury.  6/28: Patient reports on 6/23 she was lifting a bathroom divider when she felt a pull deep anterior left sohulder. Felt some swelling here.  No bruising. No prior left shoulder injuries. Is right handed. Using a sling now, taking advil, using heat. Had some soreness when moving things around at work that day before this acute injury. Pain is 7/10, sharp. No skin changes, numbness.  7/12: Patient reports pain has worsened since last visit. States at 10/10 level, sharp and lateral. Wakes her up in middle of night. Reports she is working full duty though restrictions were written - paperwork she has with her that she showed me however notes restrictions she is to follow from her employer for specific duties based on restrictions we gave her. She tried diclofenac and reports it made her sick. Ibuprofen is not helping. Has not started PT yet. No skin changes, numbness.   Past Medical History  Diagnosis Date  . Hyperlipidemia   . Cervical radiculopathy   . Lumbar radiculopathy   . Bilateral carpal tunnel syndrome   . Migraine   . Ulcer     Current Outpatient Prescriptions on File Prior to Visit  Medication Sig Dispense Refill  . diclofenac (VOLTAREN) 75 MG EC tablet Take 1 tablet (75 mg total) by mouth 2 (two) times daily. 60 tablet 1  . levonorgestrel (MIRENA) 20 MCG/24HR IUD by Intrauterine route.    . sertraline (ZOLOFT) 50 MG tablet Take one half to one tablet daily 30 tablet 5  . Vitamin D, Ergocalciferol, (DRISDOL) 50000 UNITS CAPS capsule Take 50,000 Units by mouth once a week. Reported on 02/14/2016  0   Current Facility-Administered Medications on File Prior to Visit  Medication Dose Route Frequency Provider Last Rate Last Dose  . TDaP (BOOSTRIX) injection 0.5 mL  0.5 mL Intramuscular Once Margaree Mackintosh, MD        No past surgical history on file.  Allergies   Allergen Reactions  . Latex Other (See Comments)    Social History   Social History  . Marital Status: Single    Spouse Name: N/A  . Number of Children: N/A  . Years of Education: N/A   Occupational History  . Not on file.   Social History Main Topics  . Smoking status: Never Smoker   . Smokeless tobacco: Never Used  . Alcohol Use: No  . Drug Use: No  . Sexual Activity: No   Other Topics Concern  . Not on file   Social History Narrative    Family History  Problem Relation Age of Onset  . Cancer Mother   . Cancer Father     BP 119/75 mmHg  Pulse 96  Ht  (1.626 m)  Wt 140 lb (63.504 kg)  BMI 24.02 kg/m2  LMP 01/18/2016  Review of Systems: See HPI above.    Objective:  Physical Exam:  Gen: NAD, comfortable in exam room  Left shoulder: No swelling, ecchymoses.  No gross deformity. Mild diffuse tenderness.   Full ER, IR.  Abduction and flexion to 90 degrees, painful. Negative Hawkins, Neers. Negative Speeds, Yergasons. Strength 4/5 with empty can and 5-/5 resisted internal/external rotation.  Pain all motions. NV intact distally.  Right shoulder: FROM without pain.  MSK u/s Left shoulder:  Biceps tendon intact on trans and long views, no abnormalities.  AC joint with minimal arthropathy.  Subscapularis,  infraspinatus normal without tears or impingement.  Very small partial insertional tear of supraspinatus but again no retraction, no extension of tear.  No bursitis.    Assessment & Plan:  1. Left shoulder injury - consistent with rotator cuff strain, partial supraspinatus tear.  Ultrasound is reassuring despite her reports of increased pain.  Emphasized importance of physical therapy, home exercises though hasn't heard about physical therapy yet.  Ibuprofen up to 3 times a day.  Continue light duty.  Icing as needed.  F/u in 4 weeks.

## 2016-05-28 NOTE — Assessment & Plan Note (Signed)
consistent with rotator cuff strain, partial supraspinatus tear.  Ultrasound is reassuring despite her reports of increased pain.  Emphasized importance of physical therapy, home exercises though hasn't heard about physical therapy yet.  Ibuprofen up to 3 times a day.  Continue light duty.  Icing as needed.  F/u in 4 weeks.

## 2016-05-30 DIAGNOSIS — E559 Vitamin D deficiency, unspecified: Secondary | ICD-10-CM | POA: Diagnosis not present

## 2016-05-30 DIAGNOSIS — R5382 Chronic fatigue, unspecified: Secondary | ICD-10-CM | POA: Diagnosis not present

## 2016-05-30 DIAGNOSIS — N951 Menopausal and female climacteric states: Secondary | ICD-10-CM | POA: Diagnosis not present

## 2016-05-30 DIAGNOSIS — R635 Abnormal weight gain: Secondary | ICD-10-CM | POA: Diagnosis not present

## 2016-06-04 MED FILL — METHYLPREDNISOLONE 4 MG TAB: 4 | 6 days supply | Qty: 21 | Fill #0

## 2016-06-07 DIAGNOSIS — M25511 Pain in right shoulder: Secondary | ICD-10-CM | POA: Diagnosis not present

## 2016-06-07 DIAGNOSIS — F430 Acute stress reaction: Secondary | ICD-10-CM | POA: Diagnosis not present

## 2016-06-07 DIAGNOSIS — F411 Generalized anxiety disorder: Secondary | ICD-10-CM | POA: Diagnosis not present

## 2016-06-15 ENCOUNTER — Encounter: Payer: Self-pay | Admitting: Internal Medicine

## 2016-06-15 ENCOUNTER — Ambulatory Visit (INDEPENDENT_AMBULATORY_CARE_PROVIDER_SITE_OTHER): Payer: Federal, State, Local not specified - PPO | Admitting: Internal Medicine

## 2016-06-15 VITALS — BP 102/80 | HR 75 | Ht 64.0 in | Wt 149.0 lb

## 2016-06-15 DIAGNOSIS — F32A Depression, unspecified: Secondary | ICD-10-CM

## 2016-06-15 DIAGNOSIS — F329 Major depressive disorder, single episode, unspecified: Secondary | ICD-10-CM | POA: Diagnosis not present

## 2016-06-15 DIAGNOSIS — R5383 Other fatigue: Secondary | ICD-10-CM

## 2016-06-15 DIAGNOSIS — Z658 Other specified problems related to psychosocial circumstances: Secondary | ICD-10-CM | POA: Diagnosis not present

## 2016-06-15 DIAGNOSIS — F411 Generalized anxiety disorder: Secondary | ICD-10-CM

## 2016-06-15 DIAGNOSIS — M25512 Pain in left shoulder: Secondary | ICD-10-CM | POA: Diagnosis not present

## 2016-06-15 DIAGNOSIS — F439 Reaction to severe stress, unspecified: Secondary | ICD-10-CM

## 2016-06-15 LAB — CBC WITH DIFFERENTIAL/PLATELET
BASOS PCT: 0 %
Basophils Absolute: 0 cells/uL (ref 0–200)
EOS ABS: 210 {cells}/uL (ref 15–500)
Eosinophils Relative: 2 %
HCT: 38.1 % (ref 35.0–45.0)
Hemoglobin: 12.5 g/dL (ref 11.7–15.5)
LYMPHS PCT: 32 %
Lymphs Abs: 3360 cells/uL (ref 850–3900)
MCH: 30.6 pg (ref 27.0–33.0)
MCHC: 32.8 g/dL (ref 32.0–36.0)
MCV: 93.4 fL (ref 80.0–100.0)
MONOS PCT: 6 %
MPV: 9 fL (ref 7.5–12.5)
Monocytes Absolute: 630 cells/uL (ref 200–950)
Neutro Abs: 6300 cells/uL (ref 1500–7800)
Neutrophils Relative %: 60 %
PLATELETS: 380 10*3/uL (ref 140–400)
RBC: 4.08 MIL/uL (ref 3.80–5.10)
RDW: 13.5 % (ref 11.0–15.0)
WBC: 10.5 10*3/uL (ref 3.8–10.8)

## 2016-06-15 LAB — TSH: TSH: 1.84 m[IU]/L

## 2016-06-15 MED ORDER — SERTRALINE HCL 100 MG PO TABS: 100.0000 mg | ORAL_TABLET | Freq: Every day | ORAL | 3 refills | Status: DC

## 2016-06-15 MED ORDER — CLONAZEPAM 0.5 MG PO TABS: 0.5000 mg | ORAL_TABLET | Freq: Two times a day (BID) | ORAL | 0 refills | Status: DC

## 2016-06-16 DIAGNOSIS — F419 Anxiety disorder, unspecified: Secondary | ICD-10-CM

## 2016-06-18 ENCOUNTER — Telehealth: Payer: Self-pay

## 2016-06-18 NOTE — Telephone Encounter (Addendum)
Called patient. Gave lab results. Patient verbalized understanding.  

## 2016-06-18 NOTE — Telephone Encounter (Signed)
-----   Message from Margaree Mackintosh, MD sent at 06/17/2016  1:28 PM EDT ----- TSH and CBC are normal.

## 2016-06-21 ENCOUNTER — Telehealth: Payer: Self-pay | Admitting: Internal Medicine

## 2016-06-21 NOTE — Telephone Encounter (Signed)
Spoke with patient and she has 1:30 doctor appointment somewhere nearby tomorrow.  So, she'll drop by and leave a copy of the "letter" she has received from her employer.  Advised that Dr. Lenord Fellers said she did take her out of work for 4 weeks due to anxiety/depression issues.  So, we'll take a look at the letter she has and see if/what needs to be corrected and go from there.

## 2016-06-21 NOTE — Telephone Encounter (Signed)
We will call both [facility] and [facility] to see what the issues are and pt will bring letter for [place] to review. I took her out of work x 4 weeks for anxiety depresssion issues

## 2016-06-21 NOTE — Telephone Encounter (Signed)
We wrote patient a note to be out of work for 1 week when she was here in the office.  She was to go back to Ortho and they were going to follow up on that.  She NOW says that her Ortho wants "out of this" and says that they were seeing her as "Worker's Comp" and they want her to follow up with you on this issue.  States that she needs her note to say she needs to be out of work for 3 more weeks.  I told her that you took her out of work for 1 week on the basis that she was going to follow up with Ortho and they would see her and follow up from there.    Her FMLA paperwork is for 06/15/16 through 07/16/16.  She states she received "some nasty letter" in the mail from her employer.  She wants to bring the letter by here and wants [place] to correct the letter to state that she is out of work for 4 weeks instead of 1 week.    I'm trying to understand what her FMLA is for??  Why does she need a corrected letter when she has FMLA?  Please advise.

## 2016-06-22 ENCOUNTER — Ambulatory Visit (INDEPENDENT_AMBULATORY_CARE_PROVIDER_SITE_OTHER): Payer: Federal, State, Local not specified - PPO | Admitting: Internal Medicine

## 2016-06-22 VITALS — BP 104/72 | HR 98 | Temp 97.6°F

## 2016-06-22 DIAGNOSIS — M25512 Pain in left shoulder: Secondary | ICD-10-CM

## 2016-06-22 DIAGNOSIS — Z658 Other specified problems related to psychosocial circumstances: Secondary | ICD-10-CM

## 2016-06-22 DIAGNOSIS — F329 Major depressive disorder, single episode, unspecified: Secondary | ICD-10-CM

## 2016-06-22 DIAGNOSIS — F411 Generalized anxiety disorder: Secondary | ICD-10-CM | POA: Diagnosis not present

## 2016-06-22 DIAGNOSIS — F32A Depression, unspecified: Secondary | ICD-10-CM

## 2016-06-22 DIAGNOSIS — F439 Reaction to severe stress, unspecified: Secondary | ICD-10-CM

## 2016-06-22 NOTE — Telephone Encounter (Signed)
Patient came in today about 2:10 and provided copy of letter.  She was added to the afternoon schedule and seen by Dr. Lenord Fellers.  Copies of the letter and all documentation has been scanned into patient's chart today under "Media" [employer] under today's date.  The originals have been placed in the patient's paper chart as well.  Patient was provided copy of information as well.  And, letter was faxed to [employer] today.

## 2016-06-25 DIAGNOSIS — E039 Hypothyroidism, unspecified: Secondary | ICD-10-CM | POA: Diagnosis not present

## 2016-06-25 DIAGNOSIS — M797 Fibromyalgia: Secondary | ICD-10-CM | POA: Diagnosis not present

## 2016-06-25 DIAGNOSIS — F3341 Major depressive disorder, recurrent, in partial remission: Secondary | ICD-10-CM | POA: Diagnosis not present

## 2016-06-25 DIAGNOSIS — R5382 Chronic fatigue, unspecified: Secondary | ICD-10-CM | POA: Diagnosis not present

## 2016-06-25 DIAGNOSIS — F329 Major depressive disorder, single episode, unspecified: Secondary | ICD-10-CM | POA: Diagnosis not present

## 2016-07-03 ENCOUNTER — Ambulatory Visit: Payer: Self-pay | Admitting: Internal Medicine
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
## 2016-07-04 NOTE — Patient Instructions (Signed)
Restart Zoloft 100 mg daily. Klonopin up to twice daily as needed for anxiety. FMLA form completed to be out of work for 4 weeks. Consider counseling. Consider other job options.

## 2016-07-05 ENCOUNTER — Encounter: Payer: Self-pay | Admitting: Internal Medicine

## 2016-07-05 NOTE — Progress Notes (Signed)
   Subjective:    Patient ID: Jordan Carrillo, female    DOB: 10/24/1961, 55 y.o.   MRN: [id number]  HPI  She returns today saying that she needs more documentation regarding the FMLA leave that I prefer her last week. I placed her on Zoloft and Klonopin at that time for anxiety depression. She had situational stress issues at work feeling that she was not being treated fairly at times. She had considerable anxiety depression over the deaths of 2 coworkers there. Gave her 4 weeks of FMLA leave with anticipation that she would be looking for another job. It is also come to my attention that she is now seeing [facility] for counseling.  At last visit, CBC and TSH were normal    Review of Systems     Objective:   Physical Exam  She was not examined today. I spent 15 minutes making were her once again about these issues regarding FMLA leave and employment. Her leave that I have completed has nothing to do with her complaint of left shoulder pain and injury. She is being seen by orthopedist for that. I made that clear to her today.      Assessment & Plan:  Anxiety  Depression  Situational stress  Left shoulder pain  Plan: Information faxed to her employer at her request. Leave started 06/15/2016 and was written for 4 weeks

## 2016-07-05 NOTE — Patient Instructions (Addendum)
Information requested faxed employer including let her regarding medical situation. Was taken out of work August 4. Has follow-up appointment September 7

## 2016-07-13 DIAGNOSIS — M542 Cervicalgia: Secondary | ICD-10-CM | POA: Diagnosis not present

## 2016-07-13 DIAGNOSIS — M25511 Pain in right shoulder: Secondary | ICD-10-CM | POA: Diagnosis not present

## 2016-07-19 ENCOUNTER — Ambulatory Visit (INDEPENDENT_AMBULATORY_CARE_PROVIDER_SITE_OTHER): Payer: Federal, State, Local not specified - PPO | Admitting: Internal Medicine

## 2016-07-19 ENCOUNTER — Encounter: Payer: Self-pay | Admitting: Internal Medicine

## 2016-07-19 VITALS — BP 110/68 | HR 80 | Temp 97.7°F | Ht 64.0 in | Wt 145.0 lb

## 2016-07-19 DIAGNOSIS — F32A Depression, unspecified: Secondary | ICD-10-CM

## 2016-07-19 DIAGNOSIS — F329 Major depressive disorder, single episode, unspecified: Secondary | ICD-10-CM

## 2016-07-19 DIAGNOSIS — Z658 Other specified problems related to psychosocial circumstances: Secondary | ICD-10-CM

## 2016-07-19 DIAGNOSIS — F411 Generalized anxiety disorder: Secondary | ICD-10-CM

## 2016-07-19 DIAGNOSIS — F439 Reaction to severe stress, unspecified: Secondary | ICD-10-CM

## 2016-08-08 NOTE — Patient Instructions (Addendum)
Note provided to return to work. Continue Klonopin and Zoloft. Continue counseling with [facility]. Return in 6 months or sooner if needed.

## 2016-08-08 NOTE — Progress Notes (Signed)
   Subjective:    Patient ID: Pa Colmenero, female    DOB: May 11, 1961, 55 y.o.   MRN: [id number]  HPI Patient is back for follow-up regarding situational stress at work, anxiety and grief reaction. She is feeling better. At last visit she indicated she might go to [place] to look for a place to live and employment but apparently she did not spend her time off doing that. She went to [place] where she found peace and relaxation.  The situation surrounding her FMLA was that she felt that at time she was not treated fairly by superior at work. She had considerable anxiety and depression and grief over the desk to coworkers. She was given 4 weeks of FMLA leave.  We did check CBC and TSH which were normal.    Review of Systems as above-no headaches while she was away from work.     Objective:   Physical Exam  Not examined. Spent 20 minutes speaking with her about these issues. She does seem more relaxed and less anxious.  It is come to my attention that she is now seeing [facility] Kemps for counseling but has not seen her in the past few because patient has been out-of-town in [place].      Assessment & Plan:  Anxiety depression  Situational stress  Grief reaction overdress of coworkers  Plan: Her leave started 06/15/2016. Note provided to return to work. Continue counseling. Return when necessary.

## 2016-08-21 DIAGNOSIS — Z1151 Encounter for screening for human papillomavirus (HPV): Secondary | ICD-10-CM | POA: Diagnosis not present

## 2016-08-21 DIAGNOSIS — Z6825 Body mass index (BMI) 25.0-25.9, adult: Secondary | ICD-10-CM | POA: Diagnosis not present

## 2016-08-21 DIAGNOSIS — N926 Irregular menstruation, unspecified: Secondary | ICD-10-CM | POA: Diagnosis not present

## 2016-08-21 DIAGNOSIS — Z01419 Encounter for gynecological examination (general) (routine) without abnormal findings: Secondary | ICD-10-CM | POA: Diagnosis not present

## 2016-08-29 DIAGNOSIS — N950 Postmenopausal bleeding: Secondary | ICD-10-CM | POA: Diagnosis not present

## 2016-12-13 DIAGNOSIS — R6889 Other general symptoms and signs: Secondary | ICD-10-CM | POA: Diagnosis not present

## 2016-12-13 DIAGNOSIS — F331 Major depressive disorder, recurrent, moderate: Secondary | ICD-10-CM | POA: Diagnosis not present

## 2016-12-13 DIAGNOSIS — M797 Fibromyalgia: Secondary | ICD-10-CM | POA: Diagnosis not present

## 2016-12-13 DIAGNOSIS — K5190 Ulcerative colitis, unspecified, without complications: Secondary | ICD-10-CM | POA: Diagnosis not present

## 2016-12-27 DIAGNOSIS — R0789 Other chest pain: Secondary | ICD-10-CM | POA: Diagnosis not present

## 2016-12-27 DIAGNOSIS — R5383 Other fatigue: Secondary | ICD-10-CM | POA: Diagnosis not present

## 2016-12-27 DIAGNOSIS — E663 Overweight: Secondary | ICD-10-CM | POA: Diagnosis not present

## 2016-12-27 DIAGNOSIS — M791 Myalgia: Secondary | ICD-10-CM | POA: Diagnosis not present

## 2016-12-27 DIAGNOSIS — F331 Major depressive disorder, recurrent, moderate: Secondary | ICD-10-CM | POA: Diagnosis not present

## 2016-12-27 DIAGNOSIS — M797 Fibromyalgia: Secondary | ICD-10-CM | POA: Diagnosis not present

## 2017-01-24 DIAGNOSIS — K219 Gastro-esophageal reflux disease without esophagitis: Secondary | ICD-10-CM | POA: Diagnosis not present

## 2017-01-24 DIAGNOSIS — F332 Major depressive disorder, recurrent severe without psychotic features: Secondary | ICD-10-CM | POA: Diagnosis not present

## 2017-01-24 DIAGNOSIS — K921 Melena: Secondary | ICD-10-CM | POA: Diagnosis not present

## 2017-01-24 DIAGNOSIS — M797 Fibromyalgia: Secondary | ICD-10-CM | POA: Diagnosis not present

## 2017-01-30 DIAGNOSIS — M25511 Pain in right shoulder: Secondary | ICD-10-CM | POA: Diagnosis not present

## 2017-01-30 DIAGNOSIS — G8929 Other chronic pain: Secondary | ICD-10-CM | POA: Diagnosis not present

## 2017-02-15 DIAGNOSIS — G8929 Other chronic pain: Secondary | ICD-10-CM | POA: Diagnosis not present

## 2017-02-15 DIAGNOSIS — M79601 Pain in right arm: Secondary | ICD-10-CM | POA: Diagnosis not present

## 2017-02-19 DIAGNOSIS — G8929 Other chronic pain: Secondary | ICD-10-CM | POA: Diagnosis not present

## 2017-02-19 DIAGNOSIS — M79601 Pain in right arm: Secondary | ICD-10-CM | POA: Diagnosis not present

## 2017-03-30 IMAGING — MR MR HEAD W/O CM
8 series · 48 of 48 positions shown · non-contrast
Comparison: None.

CLINICAL DATA: Chronic headaches. Severe frontal headaches and
ataxia over the last week.

EXAM:
MRI HEAD WITHOUT CONTRAST
TECHNIQUE: Multiplanar, multiecho pulse sequences of the brain and surrounding
structures were obtained without intravenous contrast.

[Series 5: T1 · sagittal · 4.0mm · 0.75mm/px · 2 of 25 slices shown (1 of 2)]
[im 1/25]
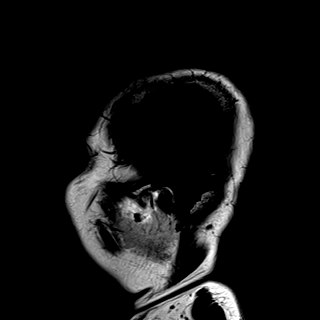
[im 25/25]
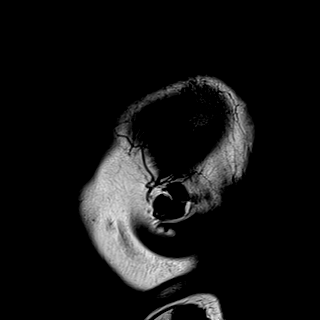

[Series 6: DWI · axial · 3.0mm · 1.44mm/px · z∈[-67,+70]mm · 9 of 86 slices shown (1 of 2)]
[im 1/86]
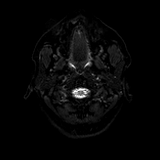
[im 11/86]
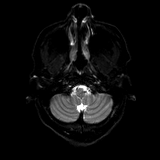
[im 22/86]
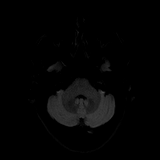
[im 32/86]
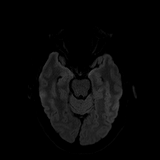
[im 43/86]
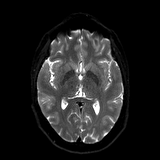
[im 54/86]
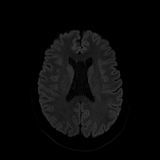
[im 64/86]
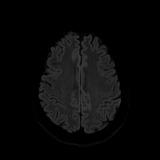
[im 75/86]
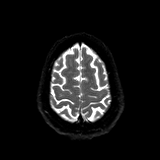
[im 86/86]
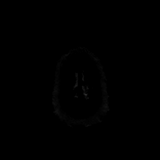

[Series 7: DWI · axial · 3.0mm · 1.44mm/px · z∈[-67,+70]mm · 4 of 43 slices shown (2 of 2)]
[im 1/43]
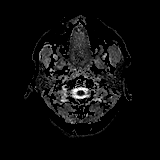
[im 15/43]
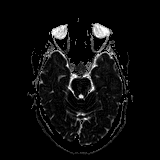
[im 29/43]
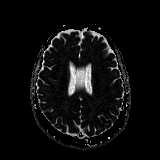
[im 43/43]
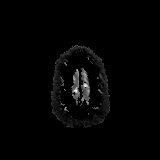

[Series 8: T2 · axial · 4.0mm · 0.36mm/px · z∈[-63,+70]mm · 3 of 27 slices shown (1 of 2)]
[im 1/27]
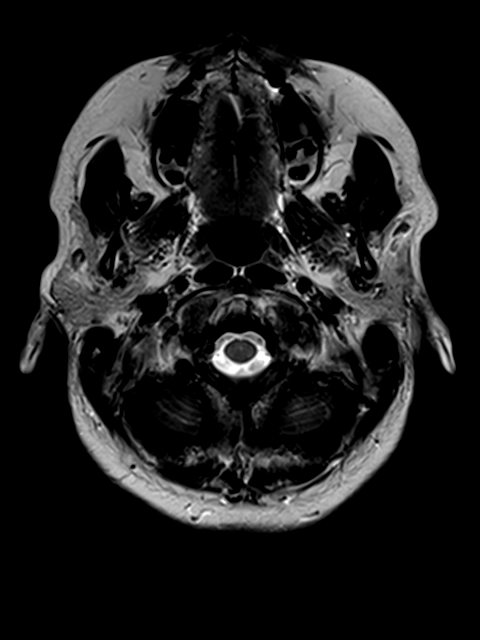
[im 14/27]
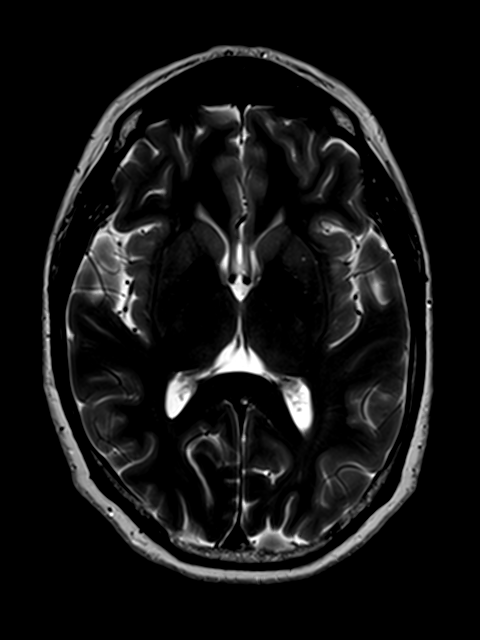
[im 27/27]
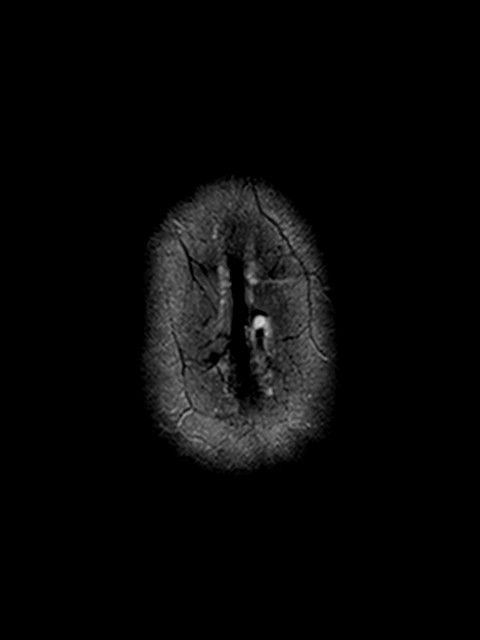

[Series 9: FLAIR · axial · 4.0mm · 0.72mm/px · z∈[-64,+69]mm · 3 of 27 slices shown]
[im 1/27]
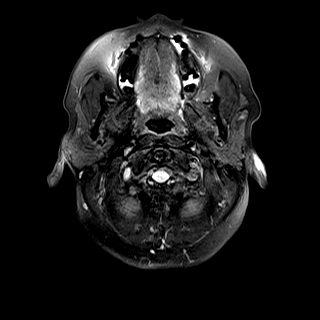
[im 14/27]
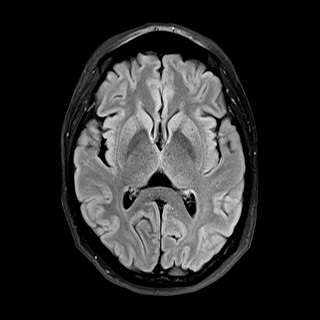
[im 27/27]
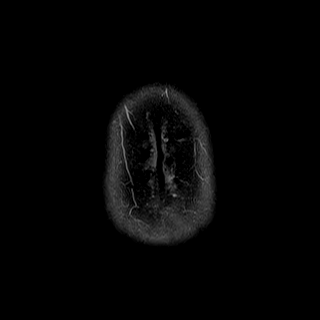

[Series 11: swi_images · axial · 1.5mm · 0.90mm/px · z∈[-67,+74]mm · 10 of 96 slices shown]
[im 1/96]
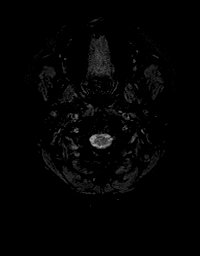
[im 11/96]
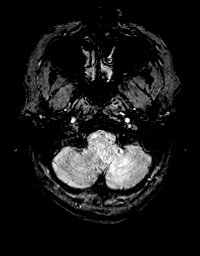
[im 22/96]
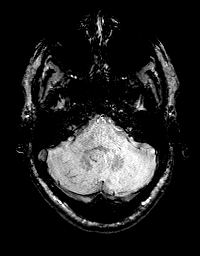
[im 32/96]
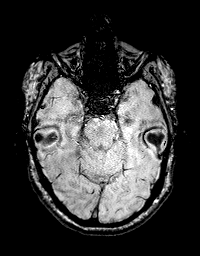
[im 43/96]
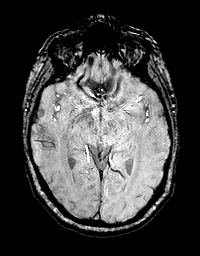
[im 53/96]
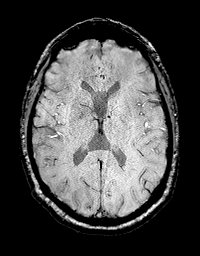
[im 64/96]
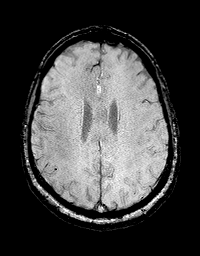
[im 74/96]
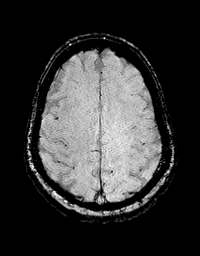
[im 85/96]
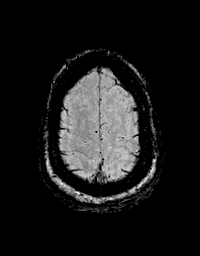
[im 96/96]
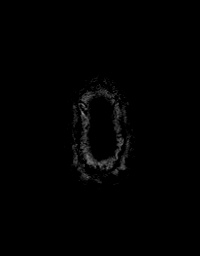

[Series 12: T1 · axial · 1.0mm · 0.90mm/px · z∈[-67,+74]mm · 14 of 144 slices shown (2 of 2)]
[im 1/144]
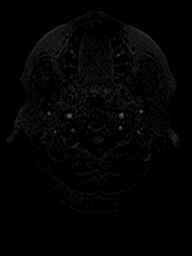
[im 12/144]
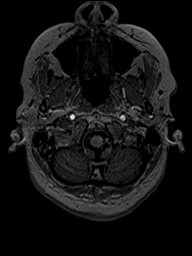
[im 23/144]
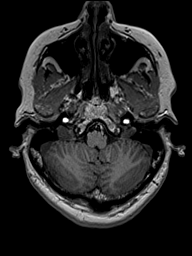
[im 34/144]
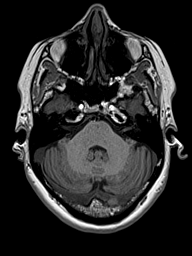
[im 45/144]
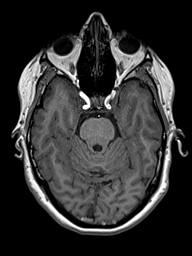
[im 56/144]
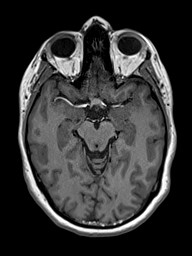
[im 67/144]
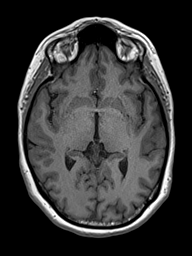
[im 78/144]
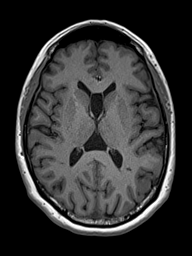
[im 89/144]
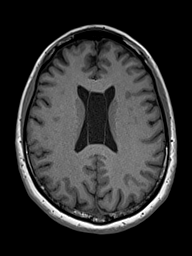
[im 100/144]
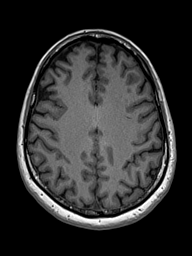
[im 111/144]
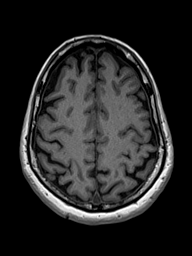
[im 122/144]
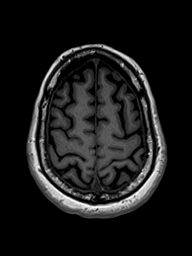
[im 133/144]
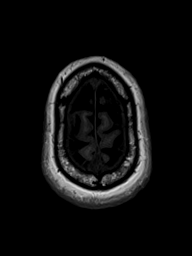
[im 144/144]
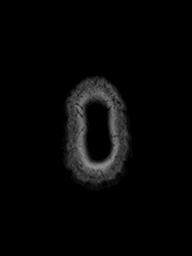

[Series 13: T2 · coronal · 4.5mm · 0.36mm/px · 3 of 30 slices shown (2 of 2)]
[im 1/30]
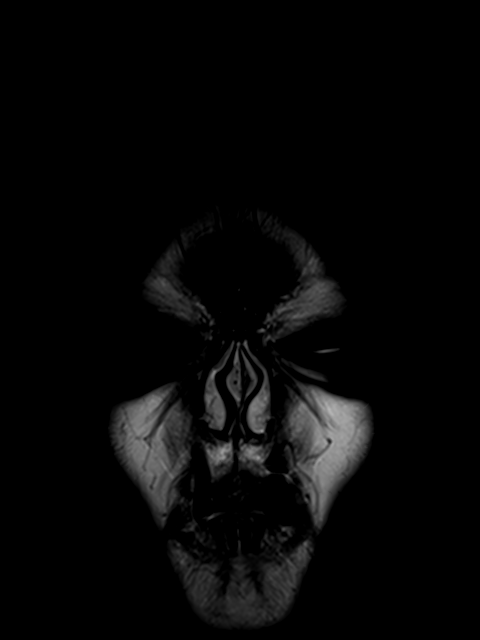
[im 15/30]
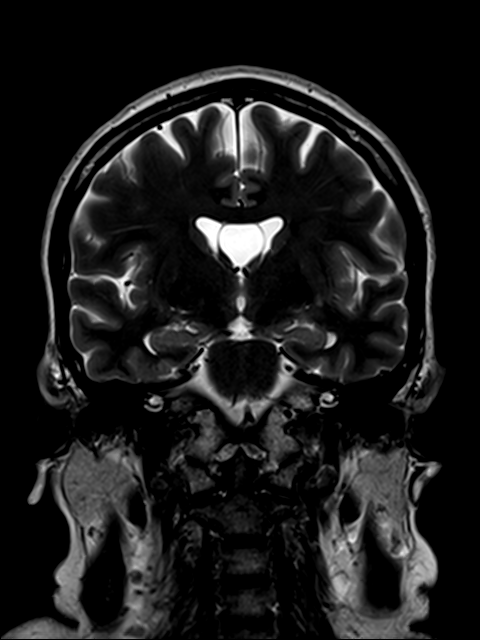
[im 30/30]
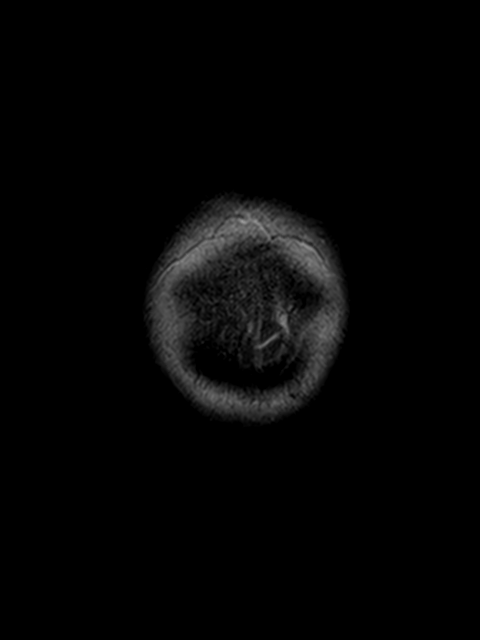

[48 of 48 positions shown; findings below may reference images not displayed]

FINDINGS: No acute infarct, hemorrhage, or mass lesion is present. The
ventricles are of normal size. No significant extraaxial fluid
collection is present.

Incidental note is made of a persistent cavum septum pellucidum. No
significant white matter changes are present.

The internal auditory canals are within normal limits. The brainstem
and cerebellum is normal. Flow is present in the major intracranial
arteries. The globes and orbits are intact. The paranasal sinuses
and mastoid air cells are clear.

Skullbase is within normal limits. Midline sagittal images are
unremarkable.
IMPRESSION: Negative MRI the brain. No acute or focal lesion to explain the
patient's headaches.

## 2017-06-03 ENCOUNTER — Encounter: Payer: Self-pay | Admitting: Internal Medicine

## 2017-06-03 ENCOUNTER — Ambulatory Visit (INDEPENDENT_AMBULATORY_CARE_PROVIDER_SITE_OTHER): Payer: Federal, State, Local not specified - PPO | Admitting: Internal Medicine

## 2017-06-03 VITALS — BP 110/68 | HR 74 | Temp 97.7°F | Wt 149.0 lb

## 2017-06-03 DIAGNOSIS — R51 Headache: Secondary | ICD-10-CM | POA: Diagnosis not present

## 2017-06-03 DIAGNOSIS — R200 Anesthesia of skin: Secondary | ICD-10-CM | POA: Diagnosis not present

## 2017-06-03 DIAGNOSIS — F329 Major depressive disorder, single episode, unspecified: Secondary | ICD-10-CM | POA: Diagnosis not present

## 2017-06-03 DIAGNOSIS — R5383 Other fatigue: Secondary | ICD-10-CM | POA: Diagnosis not present

## 2017-06-03 DIAGNOSIS — Z8719 Personal history of other diseases of the digestive system: Secondary | ICD-10-CM | POA: Diagnosis not present

## 2017-06-03 DIAGNOSIS — F32A Depression, unspecified: Secondary | ICD-10-CM

## 2017-06-03 DIAGNOSIS — R519 Headache, unspecified: Secondary | ICD-10-CM

## 2017-06-03 LAB — CBC WITH DIFFERENTIAL/PLATELET
BASOS ABS: 0 {cells}/uL (ref 0–200)
Basophils Relative: 0 %
EOS ABS: 282 {cells}/uL (ref 15–500)
Eosinophils Relative: 3 %
HCT: 37.7 % (ref 35.0–45.0)
HEMOGLOBIN: 12.4 g/dL (ref 11.7–15.5)
LYMPHS ABS: 3008 {cells}/uL (ref 850–3900)
Lymphocytes Relative: 32 %
MCH: 30.8 pg (ref 27.0–33.0)
MCHC: 32.9 g/dL (ref 32.0–36.0)
MCV: 93.5 fL (ref 80.0–100.0)
MONO ABS: 564 {cells}/uL (ref 200–950)
MPV: 9.2 fL (ref 7.5–12.5)
Monocytes Relative: 6 %
NEUTROS PCT: 59 %
Neutro Abs: 5546 cells/uL (ref 1500–7800)
PLATELETS: 380 10*3/uL (ref 140–400)
RBC: 4.03 MIL/uL (ref 3.80–5.10)
RDW: 13.4 % (ref 11.0–15.0)
WBC: 9.4 10*3/uL (ref 3.8–10.8)

## 2017-06-03 MED ORDER — PREDNISONE 10 MG PO TABS: ORAL_TABLET | ORAL | 0 refills | Status: DC

## 2017-06-03 MED ORDER — PAROXETINE HCL 10 MG PO TABS: 10.0000 mg | ORAL_TABLET | Freq: Every day | ORAL | 0 refills | Status: DC
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
## 2017-06-03 NOTE — Patient Instructions (Signed)
Start Paxil 10 mg daily. Lab work drawn and pending regarding musculoskeletal issues. Take short course of prednisone as directed and return in 4 weeks. B-12 level checked as well regarding paresthesias in feet.

## 2017-06-04 LAB — SEDIMENTATION RATE: Sed Rate: 27 mm/hr (ref 0–30)

## 2017-06-04 LAB — CYCLIC CITRUL PEPTIDE ANTIBODY, IGG: Cyclic Citrullin Peptide Ab: <16 Units

## 2017-06-04 LAB — CK TOTAL AND CKMB (NOT AT ARMC): Total CK: 57 U/L (ref 29–143)

## 2017-06-04 LAB — VITAMIN B12: VITAMIN B 12: 519 pg/mL (ref 200–1100)

## 2017-07-01 ENCOUNTER — Ambulatory Visit (INDEPENDENT_AMBULATORY_CARE_PROVIDER_SITE_OTHER): Payer: Federal, State, Local not specified - PPO | Admitting: Internal Medicine

## 2017-07-01 ENCOUNTER — Encounter: Payer: Self-pay | Admitting: Internal Medicine

## 2017-07-01 VITALS — BP 100/60 | HR 81 | Temp 98.1°F | Ht 64.0 in | Wt 149.0 lb

## 2017-07-01 DIAGNOSIS — R200 Anesthesia of skin: Secondary | ICD-10-CM

## 2017-07-01 DIAGNOSIS — G8929 Other chronic pain: Secondary | ICD-10-CM

## 2017-07-01 DIAGNOSIS — F411 Generalized anxiety disorder: Secondary | ICD-10-CM | POA: Diagnosis not present

## 2017-07-01 DIAGNOSIS — F329 Major depressive disorder, single episode, unspecified: Secondary | ICD-10-CM | POA: Diagnosis not present

## 2017-07-01 DIAGNOSIS — M791 Myalgia: Secondary | ICD-10-CM

## 2017-07-01 DIAGNOSIS — F439 Reaction to severe stress, unspecified: Secondary | ICD-10-CM

## 2017-07-01 DIAGNOSIS — M7918 Myalgia, other site: Secondary | ICD-10-CM

## 2017-07-01 DIAGNOSIS — F32A Depression, unspecified: Secondary | ICD-10-CM

## 2017-07-01 MED ORDER — GABAPENTIN 100 MG PO CAPS: 100.0000 mg | ORAL_CAPSULE | Freq: Three times a day (TID) | ORAL | 3 refills | Status: DC
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
## 2017-07-01 NOTE — Patient Instructions (Addendum)
Start gabapentin 100 mg 3 times a day. Continue Paxil and RTC in 3 months.

## 2017-08-13 DIAGNOSIS — B373 Candidiasis of vulva and vagina: Secondary | ICD-10-CM | POA: Diagnosis not present

## 2017-08-13 DIAGNOSIS — R300 Dysuria: Secondary | ICD-10-CM | POA: Diagnosis not present

## 2017-08-23 DIAGNOSIS — M79605 Pain in left leg: Secondary | ICD-10-CM | POA: Diagnosis not present

## 2017-08-23 DIAGNOSIS — R202 Paresthesia of skin: Secondary | ICD-10-CM | POA: Diagnosis not present

## 2017-08-23 DIAGNOSIS — M79604 Pain in right leg: Secondary | ICD-10-CM | POA: Diagnosis not present

## 2017-08-23 DIAGNOSIS — M79671 Pain in right foot: Secondary | ICD-10-CM | POA: Diagnosis not present

## 2017-08-29 DIAGNOSIS — G609 Hereditary and idiopathic neuropathy, unspecified: Secondary | ICD-10-CM | POA: Diagnosis not present

## 2017-08-29 DIAGNOSIS — R634 Abnormal weight loss: Secondary | ICD-10-CM | POA: Diagnosis not present

## 2017-08-29 DIAGNOSIS — R202 Paresthesia of skin: Secondary | ICD-10-CM | POA: Diagnosis not present

## 2017-08-29 DIAGNOSIS — G3184 Mild cognitive impairment, so stated: Secondary | ICD-10-CM | POA: Diagnosis not present

## 2017-08-29 DIAGNOSIS — G603 Idiopathic progressive neuropathy: Secondary | ICD-10-CM | POA: Diagnosis not present

## 2017-08-29 DIAGNOSIS — G43009 Migraine without aura, not intractable, without status migrainosus: Secondary | ICD-10-CM | POA: Diagnosis not present

## 2017-08-29 DIAGNOSIS — G5603 Carpal tunnel syndrome, bilateral upper limbs: Secondary | ICD-10-CM | POA: Diagnosis not present

## 2017-08-29 DIAGNOSIS — M5417 Radiculopathy, lumbosacral region: Secondary | ICD-10-CM | POA: Diagnosis not present

## 2017-08-29 DIAGNOSIS — M5412 Radiculopathy, cervical region: Secondary | ICD-10-CM | POA: Diagnosis not present

## 2017-09-02 ENCOUNTER — Telehealth: Payer: Self-pay

## 2017-09-02 ENCOUNTER — Encounter: Payer: Self-pay | Admitting: Neurology

## 2017-09-02 DIAGNOSIS — R200 Anesthesia of skin: Secondary | ICD-10-CM

## 2017-09-02 NOTE — Telephone Encounter (Signed)
Patient called stated she called Neuro to make an appointment and they require a referral. Okay to order?

## 2017-09-02 NOTE — Telephone Encounter (Signed)
[facility]. Molli Knock will put in referral.

## 2017-09-02 NOTE — Telephone Encounter (Signed)
Is it [facility]? If so OK

## 2017-09-16 DIAGNOSIS — R140 Abdominal distension (gaseous): Secondary | ICD-10-CM | POA: Diagnosis not present

## 2017-09-16 DIAGNOSIS — G5793 Unspecified mononeuropathy of bilateral lower limbs: Secondary | ICD-10-CM | POA: Diagnosis not present

## 2017-09-16 DIAGNOSIS — M791 Myalgia, unspecified site: Secondary | ICD-10-CM | POA: Diagnosis not present

## 2017-09-16 DIAGNOSIS — R109 Unspecified abdominal pain: Secondary | ICD-10-CM | POA: Diagnosis not present

## 2017-09-16 DIAGNOSIS — E559 Vitamin D deficiency, unspecified: Secondary | ICD-10-CM | POA: Diagnosis not present

## 2017-09-17 ENCOUNTER — Other Ambulatory Visit: Payer: Federal, State, Local not specified - PPO | Admitting: Internal Medicine

## 2017-09-17 DIAGNOSIS — Z1321 Encounter for screening for nutritional disorder: Secondary | ICD-10-CM

## 2017-09-17 DIAGNOSIS — Z130 Encounter for screening for diseases of the blood and blood-forming organs and certain disorders involving the immune mechanism: Secondary | ICD-10-CM

## 2017-09-17 DIAGNOSIS — Z0000 Encounter for general adult medical examination without abnormal findings: Secondary | ICD-10-CM

## 2017-09-17 DIAGNOSIS — Z1329 Encounter for screening for other suspected endocrine disorder: Secondary | ICD-10-CM

## 2017-09-17 DIAGNOSIS — K219 Gastro-esophageal reflux disease without esophagitis: Secondary | ICD-10-CM

## 2017-09-17 DIAGNOSIS — K50919 Crohn's disease, unspecified, with unspecified complications: Secondary | ICD-10-CM

## 2017-09-17 DIAGNOSIS — F329 Major depressive disorder, single episode, unspecified: Secondary | ICD-10-CM | POA: Diagnosis not present

## 2017-09-17 DIAGNOSIS — F419 Anxiety disorder, unspecified: Secondary | ICD-10-CM

## 2017-09-17 DIAGNOSIS — F32A Depression, unspecified: Secondary | ICD-10-CM

## 2017-09-17 DIAGNOSIS — Z13220 Encounter for screening for lipoid disorders: Secondary | ICD-10-CM

## 2017-09-18 LAB — CBC WITH DIFFERENTIAL/PLATELET
Basophils Absolute: 42 {cells}/uL (ref 0–200)
Basophils Relative: 0.5 %
Eosinophils Absolute: 176 {cells}/uL (ref 15–500)
Eosinophils Relative: 2.1 %
HCT: 35.8 % (ref 35.0–45.0)
Hemoglobin: 12.1 g/dL (ref 11.7–15.5)
Lymphs Abs: 3293 {cells}/uL (ref 850–3900)
MCH: 30.6 pg (ref 27.0–33.0)
MCHC: 33.8 g/dL (ref 32.0–36.0)
MCV: 90.6 fL (ref 80.0–100.0)
MPV: 9.5 fL (ref 7.5–12.5)
Monocytes Relative: 5.7 %
Neutro Abs: 4410 {cells}/uL (ref 1500–7800)
Neutrophils Relative %: 52.5 %
Platelets: 360 Thousand/uL (ref 140–400)
RBC: 3.95 Million/uL (ref 3.80–5.10)
RDW: 11.9 % (ref 11.0–15.0)
Total Lymphocyte: 39.2 %
WBC mixed population: 479 {cells}/uL (ref 200–950)
WBC: 8.4 Thousand/uL (ref 3.8–10.8)

## 2017-09-18 LAB — VITAMIN D 25 HYDROXY (VIT D DEFICIENCY, FRACTURES): VIT D 25 HYDROXY: 52 ng/mL (ref 30–100)

## 2017-09-18 LAB — COMPLETE METABOLIC PANEL WITH GFR
AG RATIO: 1.3 (calc) (ref 1.0–2.5)
ALBUMIN MSPROF: 4 g/dL (ref 3.6–5.1)
ALT: 15 U/L (ref 6–29)
AST: 14 U/L (ref 10–35)
Alkaline phosphatase (APISO): 78 U/L (ref 33–130)
BILIRUBIN TOTAL: 0.5 mg/dL (ref 0.2–1.2)
BUN: 16 mg/dL (ref 7–25)
CALCIUM: 9.4 mg/dL (ref 8.6–10.4)
CO2: 28 mmol/L (ref 20–32)
Chloride: 104 mmol/L (ref 98–110)
Creat: 0.76 mg/dL (ref 0.50–1.05)
GFR, EST AFRICAN AMERICAN: 103 mL/min/{1.73_m2} (ref >=60)
GFR, EST NON AFRICAN AMERICAN: 89 mL/min/{1.73_m2} (ref >=60)
GLOBULIN: 3.1 g/dL (ref 1.9–3.7)
Glucose, Bld: 96 mg/dL (ref 65–99)
POTASSIUM: 4.4 mmol/L (ref 3.5–5.3)
Sodium: 139 mmol/L (ref 135–146)
TOTAL PROTEIN: 7.1 g/dL (ref 6.1–8.1)

## 2017-09-18 LAB — LIPID PANEL
Cholesterol: 269 mg/dL — ABNORMAL HIGH (ref <200)
HDL: 58 mg/dL (ref >50)
LDL CHOLESTEROL (CALC): 187 mg/dL — AB
NON-HDL CHOLESTEROL (CALC): 211 mg/dL — AB (ref <130)
TRIGLYCERIDES: 114 mg/dL (ref <150)
Total CHOL/HDL Ratio: 4.6 (calc) (ref <5.0)

## 2017-09-18 LAB — TSH: TSH: 1.68 m[IU]/L

## 2017-09-24 ENCOUNTER — Encounter: Payer: Self-pay | Admitting: Internal Medicine

## 2017-09-24 ENCOUNTER — Ambulatory Visit: Payer: Federal, State, Local not specified - PPO | Admitting: Internal Medicine

## 2017-09-24 VITALS — BP 100/60 | HR 90 | Temp 97.7°F | Ht 64.0 in | Wt 149.0 lb

## 2017-09-24 DIAGNOSIS — Z0000 Encounter for general adult medical examination without abnormal findings: Secondary | ICD-10-CM | POA: Diagnosis not present

## 2017-09-24 DIAGNOSIS — R5383 Other fatigue: Secondary | ICD-10-CM | POA: Diagnosis not present

## 2017-09-24 DIAGNOSIS — Z87898 Personal history of other specified conditions: Secondary | ICD-10-CM | POA: Diagnosis not present

## 2017-09-24 DIAGNOSIS — Z8669 Personal history of other diseases of the nervous system and sense organs: Secondary | ICD-10-CM

## 2017-09-24 DIAGNOSIS — F439 Reaction to severe stress, unspecified: Secondary | ICD-10-CM | POA: Diagnosis not present

## 2017-09-24 DIAGNOSIS — G8929 Other chronic pain: Secondary | ICD-10-CM | POA: Diagnosis not present

## 2017-09-24 DIAGNOSIS — M7918 Myalgia, other site: Secondary | ICD-10-CM | POA: Diagnosis not present

## 2017-09-24 DIAGNOSIS — E041 Nontoxic single thyroid nodule: Secondary | ICD-10-CM | POA: Diagnosis not present

## 2017-09-24 DIAGNOSIS — E7800 Pure hypercholesterolemia, unspecified: Secondary | ICD-10-CM | POA: Diagnosis not present

## 2017-09-24 DIAGNOSIS — G5603 Carpal tunnel syndrome, bilateral upper limbs: Secondary | ICD-10-CM

## 2017-09-24 DIAGNOSIS — F411 Generalized anxiety disorder: Secondary | ICD-10-CM

## 2017-09-24 DIAGNOSIS — Z8719 Personal history of other diseases of the digestive system: Secondary | ICD-10-CM

## 2017-09-24 DIAGNOSIS — Z131 Encounter for screening for diabetes mellitus: Secondary | ICD-10-CM

## 2017-09-24 LAB — POCT URINALYSIS DIPSTICK
Bilirubin, UA: NEGATIVE
Blood, UA: NEGATIVE
Glucose, UA: NEGATIVE
KETONES UA: NEGATIVE
Leukocytes, UA: NEGATIVE
Nitrite, UA: NEGATIVE
PROTEIN UA: NEGATIVE
SPEC GRAV UA: 1.025 (ref 1.010–1.025)
UROBILINOGEN UA: 0.2 U/dL
pH, UA: 6 (ref 5.0–8.0)
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
## 2017-09-25 ENCOUNTER — Telehealth: Payer: Self-pay

## 2017-09-25 LAB — HEMOGLOBIN A1C
Hgb A1c MFr Bld: 6 % of total Hgb — ABNORMAL HIGH (ref <5.7)
Mean Plasma Glucose: 126 (calc)
eAG (mmol/L): 7 (calc)

## 2017-09-25 NOTE — Telephone Encounter (Signed)
Pt is aware and declines making appointment right now states she will call back

## 2017-09-25 NOTE — Telephone Encounter (Signed)
-----   Message from Margaree Mackintosh, MD sent at 09/25/2017 10:47 AM EST ----- She has impaired glucose tolerance. Normal is less than 5.7% Please tell her to watch carbs and have this repeated in 6 months.

## 2017-09-27 ENCOUNTER — Telehealth: Payer: Self-pay | Admitting: Internal Medicine

## 2017-09-27 MED ORDER — PREDNISONE 10 MG PO TABS: ORAL_TABLET | ORAL | 0 refills | Status: DC

## 2017-09-27 NOTE — Telephone Encounter (Signed)
Patient called back.  Advised that we would call this in for her. Explained how she would take the medication.  And, if this didn't work for her, she would have to come back into the office.  Patient verbalized understanding of these instructions.

## 2017-09-27 NOTE — Telephone Encounter (Signed)
States she has had a headache since Tuesday.  It's throbbing.  She has tried Excedrin, Tylenol, Icepak.  She has tried everything that she normally tries and it hasn't stopped the headache.  States that you asked her about her headaches when she was here earlier in the week.  However, she had one once she left and it just won't go away.  Wants to know if you could prescribe her something for this headache because this one just won't go away.    Pharmacy:  [facility] in [place] # for contact:  [phone]

## 2017-09-27 NOTE — Telephone Encounter (Signed)
Cannot give Narcotic over the phone. I think she should try a Prednisone taper going from 60 mg to 0 mg over 7 days  #21 Prednisone 10 mg tabs.

## 2017-09-27 NOTE — Telephone Encounter (Signed)
Unable to LVM for pt, mailbox is not set up

## 2017-09-27 NOTE — Telephone Encounter (Signed)
Medication Sent

## 2017-10-14 DIAGNOSIS — R140 Abdominal distension (gaseous): Secondary | ICD-10-CM | POA: Diagnosis not present

## 2017-10-14 DIAGNOSIS — G5793 Unspecified mononeuropathy of bilateral lower limbs: Secondary | ICD-10-CM | POA: Diagnosis not present

## 2017-10-14 DIAGNOSIS — M791 Myalgia, unspecified site: Secondary | ICD-10-CM | POA: Diagnosis not present

## 2017-10-14 DIAGNOSIS — R109 Unspecified abdominal pain: Secondary | ICD-10-CM | POA: Diagnosis not present

## 2017-10-24 DIAGNOSIS — Z1151 Encounter for screening for human papillomavirus (HPV): Secondary | ICD-10-CM | POA: Diagnosis not present

## 2017-10-24 DIAGNOSIS — R3129 Other microscopic hematuria: Secondary | ICD-10-CM | POA: Diagnosis not present

## 2017-10-24 DIAGNOSIS — Z01419 Encounter for gynecological examination (general) (routine) without abnormal findings: Secondary | ICD-10-CM | POA: Diagnosis not present

## 2017-10-24 DIAGNOSIS — R300 Dysuria: Secondary | ICD-10-CM | POA: Diagnosis not present

## 2017-10-24 DIAGNOSIS — Z6825 Body mass index (BMI) 25.0-25.9, adult: Secondary | ICD-10-CM | POA: Diagnosis not present

## 2017-11-06 ENCOUNTER — Encounter: Payer: Self-pay | Admitting: Neurology

## 2017-11-08 ENCOUNTER — Telehealth: Payer: Self-pay | Admitting: Internal Medicine

## 2017-11-08 NOTE — Telephone Encounter (Signed)
Pt called to give the dates for the [facility]  11/16 and 11/30. Pt did not call them she is going to call them today. Thank you!

## 2017-11-09 NOTE — Patient Instructions (Signed)
Recommend watching fat in diet.  Refuses cholesterol medicine and medication for impaired glucose tolerance.

## 2017-11-12 DIAGNOSIS — E0500 Thyrotoxicosis with diffuse goiter without thyrotoxic crisis or storm: Secondary | ICD-10-CM

## 2017-11-12 DIAGNOSIS — K5090 Crohn's disease, unspecified, without complications: Secondary | ICD-10-CM

## 2017-11-12 HISTORY — DX: Thyrotoxicosis with diffuse goiter without thyrotoxic crisis or storm: E05.00

## 2017-11-12 HISTORY — DX: Crohn's disease, unspecified, without complications: K50.90

## 2017-11-28 DIAGNOSIS — H40013 Open angle with borderline findings, low risk, bilateral: Secondary | ICD-10-CM | POA: Diagnosis not present

## 2017-12-04 ENCOUNTER — Ambulatory Visit: Payer: Self-pay | Admitting: Neurology

## 2017-12-06 ENCOUNTER — Other Ambulatory Visit: Payer: Self-pay | Admitting: Internal Medicine

## 2017-12-06 MED ORDER — PREDNISONE 10 MG PO TABS: ORAL_TABLET | ORAL | 0 refills | Status: DC

## 2017-12-06 NOTE — Telephone Encounter (Signed)
E-scribed, patient was notified.

## 2017-12-06 NOTE — Telephone Encounter (Signed)
Call in Prednisone 10 mg tabs( #21) Take in tapering course as directed 6-5-4-3-2-1

## 2017-12-06 NOTE — Telephone Encounter (Signed)
Has appointment with Neurology with 2/23.  She had a shot in the back of her neck and it seems that this is when the headaches started in the back of her head and neck.   She has a terrible headache and it just won't let up.  She wants to know if you will call some Prednisone in for her?    Pharmacy:  [facility] in [place] [employer] # [phone]

## 2017-12-24 DIAGNOSIS — F411 Generalized anxiety disorder: Secondary | ICD-10-CM | POA: Diagnosis not present

## 2017-12-24 DIAGNOSIS — F332 Major depressive disorder, recurrent severe without psychotic features: Secondary | ICD-10-CM | POA: Diagnosis not present

## 2017-12-24 DIAGNOSIS — R42 Dizziness and giddiness: Secondary | ICD-10-CM | POA: Diagnosis not present

## 2017-12-24 DIAGNOSIS — M797 Fibromyalgia: Secondary | ICD-10-CM | POA: Diagnosis not present

## 2017-12-24 DIAGNOSIS — R200 Anesthesia of skin: Secondary | ICD-10-CM | POA: Diagnosis not present

## 2018-01-06 ENCOUNTER — Ambulatory Visit (INDEPENDENT_AMBULATORY_CARE_PROVIDER_SITE_OTHER): Payer: Federal, State, Local not specified - PPO | Admitting: Neurology

## 2018-01-06 ENCOUNTER — Encounter: Payer: Self-pay | Admitting: Neurology

## 2018-01-06 VITALS — BP 104/60 | HR 86 | Ht 64.0 in | Wt 149.0 lb

## 2018-01-06 DIAGNOSIS — R202 Paresthesia of skin: Secondary | ICD-10-CM

## 2018-01-06 NOTE — Patient Instructions (Signed)
NCS/EMG of the legs   We will request records from Dr. Alesia Richards office in [facility] to review

## 2018-01-06 NOTE — Progress Notes (Signed)
[facility] Neurology Division Clinic Note - Initial Visit   Date: 01/06/18  Jordan Carrillo MRN: [id number] DOB: Apr 16, 1961   Dear Dr. Lenord Fellers:  Thank you for your kind referral of Jordan Carrillo for consultation of bilateral leg paresthesias. Although her history is well known to you, please allow [place] to reiterate it for the purpose of our medical record. The patient was accompanied to the clinic by self.   History of Present Illness: Jordan Carrillo is a 57 y.o. right-handed female presenting for evaluation of bilateral leg paresthesias.    She is a poor historian and does not elaborate on her symptoms, even with direct questioning.  From what I can gather, she has been seeing Dr. Estella Husk at [facility] since 2009 for headaches and leg paresthesias.  She was unhappy that he offered medications for her symptoms but not a cure.  She is unsure of what testing has been done.  Upon review of Dr. Beryle Quant notes, she had NCS/EMG which shoed bilateral CTS, worse on the right.  Her migraines and pain has been treated with topiramate, maxalt, and gabapentin. Today, she is here with complaints of numbness involving both thighs which started in August 2018.  She also has shooting and achy pain of the feet, which is worse at night.  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  Pain is always worse at the end of the day.  She also has right shoulder pain and reduced range of motion.    She is doing functional medication through [facility] and was diagnosed with Lyme disease and parasite infection.  She uses a lot of essential oils, which has helped significantly, but still does not feel "right".  She felt that someone was poisoning her with arsenic and later found to have parasite infection after arriving back from [place] (?).    Out-side paper records, electronic medical record, and images have  been reviewed where available and summarized as:  MRI brain wo contrast 02/19/2016:  Negative MRI the brain. No acute or focal lesion to explain the patient's headaches. MRI lumbar spine 12/02/2014:  Small subligamentous protrusion L5-S1 central and to the RIGHT. This has developed since 2008. Correlate clinically for RIGHT S1 radicular symptoms.  Lab Results  Component Value Date   TSH 1.68 09/17/2017   Lab Results  Component Value Date   VITAMINB12 519 06/03/2017   Lab Results  Component Value Date   HGBA1C 6.0 (H) 09/24/2017     Past Medical History:  Diagnosis Date  . Bilateral carpal tunnel syndrome   . Cervical radiculopathy   . Hyperlipidemia   . Lumbar radiculopathy   . Migraine   . Ulcer     Past Surgical History:  Procedure Laterality Date  . NO PAST SURGERIES       Medications:  Outpatient Encounter Medications as of 01/06/2018  Medication Sig  . OVER THE COUNTER MEDICATION Vitamin d with K2  . [DISCONTINUED] cholecalciferol (VITAMIN D) 1000 units tablet Take 1,000 Units by mouth daily.  . [DISCONTINUED] gabapentin (NEURONTIN) 100 MG capsule Take 1 capsule (100 mg total) by mouth 3 (three) times daily.  . [DISCONTINUED] PARoxetine (PAXIL) 10 MG tablet Take 1 tablet (10 mg total) by mouth daily.  . [DISCONTINUED] predniSONE (DELTASONE) 10 MG tablet Taper doses 6 5 4 3 2 1   . [DISCONTINUED] TDaP (BOOSTRIX) injection 0.5 mL    No facility-administered encounter medications  on file as of 01/06/2018.      Allergies:  Allergies  Allergen Reactions  . Prednisone Anxiety  . Latex Other (See Comments)    Family History: Family History  Problem Relation Age of Onset  . Uterine cancer Mother   . Bone cancer Father   . Kidney cancer Father   . Diabetes Brother     Social History: Social History   Tobacco Use  . Smoking status: Never Smoker  . Smokeless tobacco: Never Used  Substance Use Topics  . Alcohol use: No    Alcohol/week: 0.0 oz  . Drug use:  No   Social History   Social History Narrative   She works at the post office.  She lives alone and has one child.   She had some trade school.     Review of Systems:  CONSTITUTIONAL: No fevers, chills, night sweats, or weight loss.   EYES: No visual changes or eye pain ENT: No hearing changes.  No history of nose bleeds.   RESPIRATORY: No cough, wheezing and shortness of breath.   CARDIOVASCULAR: Negative for chest pain, and palpitations.   GI: Negative for abdominal discomfort, blood in stools or black stools.  No recent change in bowel habits.   GU:  No history of incontinence.   MUSCLOSKELETAL: +history of joint pain or swelling.  No myalgias.   SKIN: Negative for lesions, rash, and itching.   HEMATOLOGY/ONCOLOGY: Negative for prolonged bleeding, bruising easily, and swollen nodes.  No history of cancer.   ENDOCRINE: Negative for cold or heat intolerance, polydipsia or goiter.   PSYCH:  No depression +anxiety symptoms.   NEURO: As Above.   Vital Signs:  BP 104/60   Pulse 86   Ht 5\' 4"  (1.626 m)   Wt 149 lb (67.6 kg)   LMP 01/18/2016 Comment: irregular periods  SpO2 95%   BMI 25.58 kg/m    General Medical Exam:   General:  Anxious-appearing, comfortable.   Eyes/ENT: see cranial nerve examination.   Neck: No masses appreciated.  Full range of motion without tenderness.  Respiratory:  Clear to auscultation, good air entry bilaterally.   Cardiac:  Regular rate and rhythm, no murmur.   Extremities:  No deformities, edema, or skin discoloration.  Skin:  No rashes or lesions.  Neurological Exam: MENTAL STATUS including orientation to time, place, person, recent and remote memory is normal.  Flight of ideas with loose association.  Speech is not dysarthric.  CRANIAL NERVES: II:  No visual field defects.  Unremarkable fundi.   III-IV-VI: Pupils equal round and reactive to light.  Normal conjugate, extra-ocular eye movements in all directions of gaze.  No nystagmus.  No  ptosis.   V:  Normal facial sensation.     VII:  Normal facial symmetry and movements.  VIII:  Normal hearing and vestibular function.   IX-X:  Normal palatal movement.   XI:  Normal shoulder shrug and head rotation.   XII:  Normal tongue strength and range of motion, no deviation or fasciculation.  MOTOR:  No atrophy, fasciculations or abnormal movements.  No pronator drift.  Tone is normal.  Range of motion with right shoulder abduction is reduced.   Right Upper Extremity:    Left Upper Extremity:    Deltoid  5/5   Deltoid  5/5   Biceps  5/5   Biceps  5/5   Triceps  5/5   Triceps  5/5   Wrist extensors  5/5   Wrist extensors  5/5   Wrist flexors  5/5   Wrist flexors  5/5   Finger extensors  5/5   Finger extensors  5/5   Finger flexors  5/5   Finger flexors  5/5   Dorsal interossei  5/5   Dorsal interossei  5/5   Abductor pollicis  5/5   Abductor pollicis  5/5   Tone (Ashworth scale)  0  Tone (Ashworth scale)  0   Right Lower Extremity:    Left Lower Extremity:    Hip flexors  5/5   Hip flexors  5/5   Hip extensors  5/5   Hip extensors  5/5   Knee flexors  5/5   Knee flexors  5/5   Knee extensors  5/5   Knee extensors  5/5   Dorsiflexors  5/5   Dorsiflexors  5/5   Plantarflexors  5/5   Plantarflexors  5/5   Toe extensors  5/5   Toe extensors  5/5   Toe flexors  5/5   Toe flexors  5/5   Tone (Ashworth scale)  0  Tone (Ashworth scale)  0   MSRs:  Right                                                                 Left brachioradialis 2+  brachioradialis 2+  biceps 2+  biceps 2+  triceps 2+  triceps 2+  patellar 2+  patellar 2+  ankle jerk 2+  ankle jerk 2+  Hoffman no  Hoffman no  plantar response down  plantar response down   SENSORY:  Patchy sensory loss to temperature and pin prick on the left foot, otherwise normal and intact to all modalities.  COORDINATION/GAIT: Normal finger-to- nose-finger.  Intact rapid alternating movements bilaterally.  Gait narrow based and  stable. Tandem and stressed gait intact.    IMPRESSION: Bilateral leg paresthesias, nonspecific.  Symptoms do not conform to a cutaneous nerve or dermatomal distribution.  Her exam shows subjective and patchy sensory changes in the left leg, but exam is symmetric without any worrisome findings.  I will order NCS/EMG of the legs to better characterize the nature of her symptoms.  I will also request records from Dr. Alesia Richards office to review as patient is not a very good historian and history taking his challenging with her flight of ideas and loose associations.  Further recommendations will be based on the results of her testing.   Thank you for allowing me to participate in patient's care.  If I can answer any additional questions, I would be pleased to do so.    Sincerely,    Donika K. Allena Katz, DO

## 2018-01-10 NOTE — Progress Notes (Signed)
Addendum  Office visits and EMG reports from Dr.Runheim's office reviewed.  She was being followed for chronic neck, low back pain, headaches, and CTS.  Her last NCS/EMG 08/29/2017 showed mild bilateral CTS, mild sensorimotor polyneuropathy, and worsening multilevel chronic cervical and lumbosacral radiculopathies. She was offered ESI and declined.    Headaches medications:  Topiramate 100mg /d, maxalt 10mg , imitrex 100mg  Chronic pain medication:  Lyrica 100mg  TID, gabapentin 900mg /d  Last clinic note and EMG will be scanned into Epic.  Jordan Brierley K. Allena Katz, DO

## 2018-01-16 ENCOUNTER — Encounter: Payer: Self-pay | Admitting: Neurology

## 2018-01-20 DIAGNOSIS — R3121 Asymptomatic microscopic hematuria: Secondary | ICD-10-CM | POA: Diagnosis not present

## 2018-01-21 DIAGNOSIS — K5010 Crohn's disease of large intestine without complications: Secondary | ICD-10-CM | POA: Diagnosis not present

## 2018-01-21 DIAGNOSIS — K219 Gastro-esophageal reflux disease without esophagitis: Secondary | ICD-10-CM | POA: Diagnosis not present

## 2018-01-22 ENCOUNTER — Telehealth: Payer: Self-pay | Admitting: Internal Medicine

## 2018-01-22 DIAGNOSIS — R079 Chest pain, unspecified: Secondary | ICD-10-CM | POA: Diagnosis not present

## 2018-01-22 DIAGNOSIS — R55 Syncope and collapse: Secondary | ICD-10-CM | POA: Diagnosis not present

## 2018-01-22 DIAGNOSIS — R51 Headache: Secondary | ICD-10-CM | POA: Diagnosis not present

## 2018-01-22 DIAGNOSIS — R072 Precordial pain: Secondary | ICD-10-CM | POA: Diagnosis not present

## 2018-01-22 DIAGNOSIS — R0789 Other chest pain: Secondary | ICD-10-CM | POA: Diagnosis not present

## 2018-01-22 DIAGNOSIS — R7303 Prediabetes: Secondary | ICD-10-CM | POA: Diagnosis not present

## 2018-01-22 DIAGNOSIS — K219 Gastro-esophageal reflux disease without esophagitis: Secondary | ICD-10-CM | POA: Diagnosis not present

## 2018-01-22 DIAGNOSIS — R200 Anesthesia of skin: Secondary | ICD-10-CM | POA: Diagnosis not present

## 2018-01-22 DIAGNOSIS — R42 Dizziness and giddiness: Secondary | ICD-10-CM | POA: Diagnosis not present

## 2018-01-22 DIAGNOSIS — E785 Hyperlipidemia, unspecified: Secondary | ICD-10-CM | POA: Diagnosis not present

## 2018-01-22 NOTE — Telephone Encounter (Signed)
Spoke with Dr. Lenord Fellers and called patient back to advised Dr. Lenord Fellers feels this is stress related.  However, in the meantime, patient has gone to [facility].  States they are doing lots of tests on her and going to admit her.  Provided our fax # to patient and asked her to give this fax # to the hospital staff and ask them to just fax the hospital information to our office.

## 2018-01-22 NOTE — Telephone Encounter (Signed)
Patient called stating that she was feeling fuzzy in her head, weird in her body, heaviness in her heart.  A dear friend passed away on 01-02-2023 and she has been very stressed from this.  2 days ago, a co-worker had an MI at work.  He was very healthy and she's feeling very stressed about this as well.  She feels like she just needs to take a deep breath, but still has this weird feeling in her body.  Hasn't been able to rest well at all.  ? Panic attack.  Advised I would speak to Dr. Lenord Fellers and get back to her.    Phone:  [phone]

## 2018-01-23 DIAGNOSIS — R42 Dizziness and giddiness: Secondary | ICD-10-CM | POA: Diagnosis not present

## 2018-01-23 DIAGNOSIS — R079 Chest pain, unspecified: Secondary | ICD-10-CM | POA: Diagnosis not present

## 2018-01-23 DIAGNOSIS — E785 Hyperlipidemia, unspecified: Secondary | ICD-10-CM | POA: Diagnosis not present

## 2018-01-23 DIAGNOSIS — R55 Syncope and collapse: Secondary | ICD-10-CM | POA: Diagnosis not present

## 2018-01-23 NOTE — Telephone Encounter (Signed)
Patient called this morning stating that everything that she has had done at [facility] checked out fine. She is still waiting for a stress test but would like to leave and have it done out-patient. She said that the doctor at the hospital said that she looked okay and it would be okay to do it on her own time. The patient would like to know if you would order this stress test for her to have done. She explained to me that the hospital was going to do it this morning but now they are saying it could be another 6 hours and she does not want to wait there that long. She would rather go home and rest.  Please advise.

## 2018-01-23 NOTE — Telephone Encounter (Signed)
Patient was advised.  

## 2018-01-23 NOTE — Telephone Encounter (Signed)
Doctor there needs to order it since he saw her.

## 2018-01-25 MED ORDER — GENERIC EXTERNAL MEDICATION
Status: DC
Start: ? — End: 2018-01-25

## 2018-01-25 MED ORDER — NITROGLYCERIN 0.4 MG SL SUBL
0.40 | SUBLINGUAL_TABLET | SUBLINGUAL | Status: DC
Start: ? — End: 2018-01-25

## 2018-01-25 MED ORDER — PRAVASTATIN SODIUM 20 MG PO TABS
20.00 | ORAL_TABLET | ORAL | Status: DC
Start: 2018-01-23 — End: 2018-01-25

## 2018-01-25 MED ORDER — ALPRAZOLAM 0.25 MG PO TABS
0.25 | ORAL_TABLET | ORAL | Status: DC
Start: ? — End: 2018-01-25

## 2018-01-25 MED ORDER — ASPIRIN EC 81 MG PO TBEC
81.00 | DELAYED_RELEASE_TABLET | ORAL | Status: DC
Start: 2018-01-24 — End: 2018-01-25

## 2018-01-25 MED ORDER — METOCLOPRAMIDE HCL 5 MG/ML IJ SOLN
10.00 | INTRAMUSCULAR | Status: DC
Start: ? — End: 2018-01-25

## 2018-01-25 MED ORDER — ENOXAPARIN SODIUM 40 MG/0.4ML ~~LOC~~ SOLN
40.00 | SUBCUTANEOUS | Status: DC
Start: 2018-01-24 — End: 2018-01-25

## 2018-01-25 MED ORDER — ZOLPIDEM TARTRATE 5 MG PO TABS
5.00 | ORAL_TABLET | ORAL | Status: DC
Start: ? — End: 2018-01-25

## 2018-01-25 MED ORDER — SENNA-DOCUSATE SODIUM 8.6-50 MG PO TABS
ORAL_TABLET | ORAL | Status: DC
Start: ? — End: 2018-01-25

## 2018-01-25 MED ORDER — DIPHENHYDRAMINE HCL 25 MG PO CAPS
25.00 | ORAL_CAPSULE | ORAL | Status: DC
Start: ? — End: 2018-01-25

## 2018-01-25 MED ORDER — MORPHINE SULFATE (PF) 2 MG/ML IV SOLN
2.00 | INTRAVENOUS | Status: DC
Start: ? — End: 2018-01-25

## 2018-01-28 ENCOUNTER — Ambulatory Visit (INDEPENDENT_AMBULATORY_CARE_PROVIDER_SITE_OTHER): Payer: Federal, State, Local not specified - PPO | Admitting: Neurology

## 2018-01-28 DIAGNOSIS — R202 Paresthesia of skin: Secondary | ICD-10-CM | POA: Diagnosis not present

## 2018-01-28 NOTE — Procedures (Signed)
[facility]  [address] [place], [address]  [place], [place] [address] Tel: [phone] Fax:  [phone] Test Date:  01/28/2018  Patient: Jordan Carrillo DOB: 12-02-1960 Physician: Nita Sickle, DO  Sex: Female Height: 5\' 4"  Ref Phys: Nita Sickle, DO  ID#: [id number] Temp: 32.1C Technician:    Patient Complaints: This is a 57 year old female referred for evaluation of bilateral leg pain and paresthesias.  NCV & EMG Findings: Extensive electrodiagnostic testing of the right lower extremity and additional studies of the left shows:  1. Bilateral sural and superficial peroneal sensory responses are within normal limits. 2. Bilateral peroneal and tibial motor responses are within normal limits. 3. Bilateral tibial H reflex studies are within normal limits. 4. There is no evidence of active or chronic motor axon loss changes affecting any of the tested muscles. Motor unit configuration and recruitment pattern is within normal limits.  Impression: This is a normal study of the lower extremities. In particular, there is no evidence of a sensorimotor polyneuropathy or lumbosacral radiculopathy.   ___________________________ Nita Sickle, DO    Nerve Conduction Studies Anti Sensory Summary Table   Site NR Peak (ms) Norm Peak (ms) P-T Amp (V) Norm P-T Amp  Left Sup Peroneal Anti Sensory (Ant Lat Mall)  32.1C  12 cm    2.6 <4.6 25.5 >4  Right Sup Peroneal Anti Sensory (Ant Lat Mall)  32.1C  12 cm    2.4 <4.6 21.1 >4  Left Sural Anti Sensory (Lat Mall)  32.1C  Calf    3.5 <4.6 27.5 >4  Right Sural Anti Sensory (Lat Mall)  32.1C  Calf    2.9 <4.6 26.7 >4   Motor Summary Table   Site NR Onset (ms) Norm Onset (ms) O-P Amp (mV) Norm O-P Amp Site1 Site2 Delta-0 (ms) Dist (cm) Vel (m/s) Norm Vel (m/s)  Left Peroneal Motor (Ext Dig Brev)  32.1C  Ankle    3.0 <6.0 6.5 >2.5 B Fib Ankle 7.3 38.0 52 >40  B Fib    10.3  5.8  Poplt B Fib 0.9 8.0 89 >40  Poplt    11.2  5.7          Right Peroneal Motor (Ext Dig Brev)  32.1C  Ankle    2.7 <6.0 6.3 >2.5 B Fib Ankle 7.1 38.0 54 >40  B Fib    9.8  6.2  Poplt B Fib 1.2 8.0 67 >40  Poplt    11.0  6.0         Left Tibial Motor (Abd Hall Brev)  32.1C  Ankle    3.8 <6.0 13.5 >4 Knee Ankle 7.8 42.0 54 >40  Knee    11.6  9.0         Right Tibial Motor (Abd Hall Brev)  32.1C  Ankle    3.0 <6.0 11.8 >4 Knee Ankle 7.9 39.0 49 >40  Knee    10.9  7.2          H Reflex Studies   NR H-Lat (ms) Lat Norm (ms) L-R H-Lat (ms)  Left Tibial (Gastroc)  32.1C     29.39 <35 0.00  Right Tibial (Gastroc)  32.1C     29.39 <35 0.00   EMG   Side Muscle Ins Act Fibs Psw Fasc Number Recrt Dur Dur. Amp Amp. Poly Poly. Comment  Right AntTibialis Nml Nml Nml Nml Nml Nml Nml Nml Nml Nml Nml Nml N/A  Right Gastroc Nml Nml Nml Nml Nml Nml Nml Nml  Nml Nml Nml Nml N/A  Right Flex Dig Long Nml Nml Nml Nml Nml Nml Nml Nml Nml Nml Nml Nml N/A  Right RectFemoris Nml Nml Nml Nml Nml Nml Nml Nml Nml Nml Nml Nml N/A  Right GluteusMed Nml Nml Nml Nml Nml Nml Nml Nml Nml Nml Nml Nml N/A  Left BicepsFemS Nml Nml Nml Nml Nml Nml Nml Nml Nml Nml Nml Nml N/A  Right BicepsFemS Nml Nml Nml Nml Nml Nml Nml Nml Nml Nml Nml Nml N/A  Left AntTibialis Nml Nml Nml Nml Nml Nml Nml Nml Nml Nml Nml Nml N/A  Left Gastroc Nml Nml Nml Nml Nml Nml Nml Nml Nml Nml Nml Nml N/A  Left Flex Dig Long Nml Nml Nml Nml Nml Nml Nml Nml Nml Nml Nml Nml N/A  Left RectFemoris Nml Nml Nml Nml Nml Nml Nml Nml Nml Nml Nml Nml N/A  Left GluteusMed Nml Nml Nml Nml Nml Nml Nml Nml Nml Nml Nml Nml N/A      Waveforms:

## 2018-01-29 ENCOUNTER — Telehealth: Payer: Self-pay | Admitting: *Deleted

## 2018-01-29 ENCOUNTER — Encounter: Payer: Self-pay | Admitting: *Deleted

## 2018-01-29 NOTE — Telephone Encounter (Signed)
-----   Message from Donika K Patel, DO sent at 01/29/2018 11:43 AM EDT ----- Please inform patient that her nerve testing is normal and does not show any reason for her leg pain.  Her MRI lumbar spine from 2016 did not show severe nerve impingement to cause her widespread pain.  We can repeat her MRI lumbar wo spine, if she is still having severe pain.  Thanks. 

## 2018-01-29 NOTE — Telephone Encounter (Signed)
-----   Message from Glendale Chard, DO sent at 01/29/2018 11:43 AM EDT ----- Please inform patient that her nerve testing is normal and does not show any reason for her leg pain.  Her MRI lumbar spine from 2016 did not show severe nerve impingement to cause her widespread pain.  We can repeat her MRI lumbar wo spine, if she is still having severe pain.  Thanks.

## 2018-01-29 NOTE — Telephone Encounter (Signed)
Attempted to contact patient again.  No answer and then the phone just hung up.

## 2018-01-29 NOTE — Telephone Encounter (Signed)
Attempted to contact patient.  Phone rang and then just hung up.

## 2018-01-29 NOTE — Telephone Encounter (Signed)
Letter sent giving patient results and instructions.

## 2018-01-30 ENCOUNTER — Ambulatory Visit: Payer: Federal, State, Local not specified - PPO | Admitting: Internal Medicine

## 2018-01-30 ENCOUNTER — Encounter: Payer: Self-pay | Admitting: Internal Medicine

## 2018-01-30 VITALS — BP 120/80 | HR 78 | Temp 98.1°F | Ht 64.0 in | Wt 146.0 lb

## 2018-01-30 DIAGNOSIS — F439 Reaction to severe stress, unspecified: Secondary | ICD-10-CM

## 2018-01-30 DIAGNOSIS — Z8719 Personal history of other diseases of the digestive system: Secondary | ICD-10-CM | POA: Diagnosis not present

## 2018-01-30 DIAGNOSIS — Z8669 Personal history of other diseases of the nervous system and sense organs: Secondary | ICD-10-CM | POA: Diagnosis not present

## 2018-01-30 DIAGNOSIS — R7302 Impaired glucose tolerance (oral): Secondary | ICD-10-CM

## 2018-01-30 DIAGNOSIS — R079 Chest pain, unspecified: Secondary | ICD-10-CM

## 2018-01-30 DIAGNOSIS — R0789 Other chest pain: Secondary | ICD-10-CM | POA: Diagnosis not present

## 2018-01-30 DIAGNOSIS — E7800 Pure hypercholesterolemia, unspecified: Secondary | ICD-10-CM | POA: Diagnosis not present

## 2018-01-30 DIAGNOSIS — R42 Dizziness and giddiness: Secondary | ICD-10-CM

## 2018-01-30 MED ORDER — MECLIZINE HCL 25 MG PO TABS: 25.0000 mg | ORAL_TABLET | Freq: Three times a day (TID) | ORAL | 0 refills | Status: DC | PRN

## 2018-01-30 NOTE — Patient Instructions (Signed)
Meclizine 25 mg 3 times daily as needed for vertigo.  Referral for stress test with cardiology.

## 2018-01-30 NOTE — Progress Notes (Signed)
   Subjective:    Patient ID: Jordan Carrillo, female    DOB: Aug 20, 1961, 57 y.o.   MRN: [id number]  HPI 57 year old Female recently admitted to hospital in [place] with some left-sided numbness and chest pain.  MI was ruled out.  She says exercise stress test was recommended as an outpatient.  She saw a Neurologist, Dr. Allena Katz, recently for paresthesias of her legs who did nerve conduction studies that were negative.  A close friend recently died of an aneurysm and that upset her a great deal.  She could not work.  She was not sleeping.  She still wants to move to [place].  She had plans to move in approximately 3 months but that is all on hold female because of all the situational stress she has had.  She is been having some dizzy spells recently that subsides spontaneously.  Seems to have to do with positional change.  She tends not to like to take medications preferring homeopathic remedies instead  She has a history of migraine headaches and ulcerative colitis.  History of significant hypercholesterolemia in November.  Total cholesterol was 269 with an LDL cholesterol of 187 but she refused lipid-lowering medication.  Triglycerides were normal.  Review of Systems see above.  She is eaten today and we cannot check lipid panel.  She wanted her cholesterol checked.  She has a history of impaired glucose tolerance in regard to follow-up with hemoglobin A1c today.     Objective:   Physical Exam Skin warm and dry.  Nodes none.  No demonstrable nystagmus.  PERRLA and extraocular movements are full.  Chest clear to auscultation.  Cardiac exam regular rate and rhythm normal S1 and S2.  Extremities without edema.       Assessment & Plan:  Situational stress  Probable benign positional vertigo-try meclizine 25 mg up to 3 times daily as needed for dizziness  Arrange for exercise tolerance test  History of pure hypercholesterolemia-she likely needs statin therapy but she has  refused  Check hemoglobin A1c with history of impaired glucose tolerance  Recent admission for chest pain-stress test advised.  MI was ruled out

## 2018-01-31 LAB — HEMOGLOBIN A1C
EAG (MMOL/L): 7.3 (calc)
Hgb A1c MFr Bld: 6.2 % of total Hgb — ABNORMAL HIGH (ref <5.7)
MEAN PLASMA GLUCOSE: 131 (calc)

## 2018-03-10 DIAGNOSIS — M25511 Pain in right shoulder: Secondary | ICD-10-CM | POA: Diagnosis not present

## 2018-03-10 DIAGNOSIS — M7501 Adhesive capsulitis of right shoulder: Secondary | ICD-10-CM | POA: Diagnosis not present

## 2018-03-14 ENCOUNTER — Ambulatory Visit (INDEPENDENT_AMBULATORY_CARE_PROVIDER_SITE_OTHER): Payer: Federal, State, Local not specified - PPO | Admitting: Neurology

## 2018-03-14 ENCOUNTER — Encounter: Payer: Self-pay | Admitting: Neurology

## 2018-03-14 ENCOUNTER — Other Ambulatory Visit: Payer: Self-pay

## 2018-03-14 VITALS — BP 108/60 | HR 84 | Ht 64.0 in | Wt 143.0 lb

## 2018-03-14 DIAGNOSIS — G5603 Carpal tunnel syndrome, bilateral upper limbs: Secondary | ICD-10-CM

## 2018-03-14 NOTE — Patient Instructions (Addendum)
NCS/EMG of the arms  Start physical therapy for carpal tunnel syndrome  Continue to use your wrist splints  Enjoy your trip to [place]!

## 2018-03-14 NOTE — Progress Notes (Signed)
Follow-up Visit   Date: 03/14/18    Jordan Carrillo MRN: [id number] DOB: [birth year]   Interim History: Jordan Carrillo is a 57 y.o. right-handed female with depression and fibromyalgia returning to the clinic with new complaints of hand numbness.  The patient was accompanied to the clinic by self.  History of present illness: She is a poor historian and does not elaborate on her symptoms, even with direct questioning.  From what I can gather, she has been seeing Dr. Estella Husk at [facility] since 2009 for headaches and leg paresthesias.  She was unhappy that he offered medications for her symptoms but not a cure.  She is unsure of what testing has been done.  Upon review of Dr. Beryle Quant notes, she had NCS/EMG which showed bilateral CTS, worse on the right.  Her migraines and pain has been treated with topiramate, maxalt, and gabapentin. Today, she is here with complaints of numbness involving both thighs which started in August 2018.  She also has shooting and achy pain of the feet, which is worse at night.  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  Pain is always worse at the end of the day.  She also has right shoulder pain and reduced range of motion.    She is doing functional medication through [facility] and was diagnosed with Lyme disease and parasite infection.  She uses a lot of essential oils, which has helped significantly, but still does not feel "right".  She felt that someone was poisoning her with arsenic and later found to have parasite infection after arriving back from [place] (?).    Office visits and EMG reports from Dr.Runheim's office reviewed.  She was being followed for chronic neck, low back pain, headaches, and CTS.  Her last NCS/EMG 08/29/2017 showed mild bilateral CTS, mild sensorimotor polyneuropathy, and worsening multilevel chronic cervical and lumbosacral  radiculopathies. She was offered ESI and declined.    Headaches medications:  Topiramate 100mg /d, maxalt 10mg , imitrex 100mg  Chronic pain medication:  Lyrica 100mg  TID, gabapentin 900mg /d   UPDATE 03/14/2018:  She is here for appointment because new complaints of numbness of the hands.  This has been ongoing for sometime and she had NCS/EMG of the arms from October 2018 which showed mild CTS.  She is compliant with using her wrist splints.  She also complains of dropping objects more frequently.  She is planning on putting her home on the market and moving back home to [place]. She also complains of right frozen shoulder and is seeing orthopeadics for this.  She no longer had shooting pain of the legs and feels this has improved with kidney detoxification.  Medications:  Current Outpatient Medications on File Prior to Visit  Medication Sig Dispense Refill  . meclizine (ANTIVERT) 25 MG tablet Take 1 tablet (25 mg total) by mouth 3 (three) times daily as needed for dizziness. 30 tablet 0  . OVER THE COUNTER MEDICATION Vitamin d with K2     No current facility-administered medications on file prior to visit.     Allergies:  Allergies  Allergen Reactions  . Prednisone Anxiety  . Latex Other (See Comments)    Review of Systems:  CONSTITUTIONAL: No fevers, chills, night sweats, or weight loss.  EYES: No visual changes or eye pain ENT: No hearing changes.  No history of nose bleeds.   RESPIRATORY: No cough, wheezing and shortness of  breath.   CARDIOVASCULAR: Negative for chest pain, and palpitations.   GI: Negative for abdominal discomfort, blood in stools or black stools.  No recent change in bowel habits.   GU:  No history of incontinence.   MUSCLOSKELETAL: +history of joint pain +swelling.  +myalgias.   SKIN: Negative for lesions, rash, and itching.   ENDOCRINE: Negative for cold or heat intolerance, polydipsia or goiter.   PSYCH:  + depression or anxiety symptoms.   NEURO: As  Above.   Vital Signs:  BP 108/60   Pulse 84   Ht 5\' 4"  (1.626 m)   Wt 143 lb (64.9 kg)   LMP 01/18/2016 Comment: irregular periods  SpO2 98%   BMI 24.55 kg/m    General: Well-appearing, comfortable Neck: No carotid bruit CV: Regular rate and rhythm Ext: No edema, right shoulder with restricted ROM  Neurological Exam: MENTAL STATUS including orientation to time, place, person, recent and remote memory, attention span and concentration, language, and fund of knowledge is normal.  Speech is not dysarthric.  CRANIAL NERVES: No visual field defects. Pupils equal round and reactive to light.  Normal conjugate, extra-ocular eye movements in all directions of gaze.  No ptosis.  Face is symmetric. Palate elevates symmetrically.  Tongue is midline.  MOTOR:  Motor strength is 5/5 in all extremities. No pronator drift.  Tone is normal.    MSRs:  Reflexes are 2+/4 throughout.  SENSORY:  Reduced pin prick over the median distribution bilaterally in the hands.  Vibration intact throughout.  Tinel's sign is negative at the wrist.   COORDINATION/GAIT:  Gait narrow based and stable.   Data: NCS/EMG of the legs 01/28/2018:  This is a normal study of the lower extremities. In particular, there is no evidence of a sensorimotor polyneuropathy or lumbosacral radiculopathy.   IMPRESSION/PLAN: Bilateral carpal tunnel syndrome, worsening.  Her CTS was mild on her NCS/EMG performed in October at [facility].  With her symptoms getting worse, despite using wrist splints recommend repeat NCS/EMG of the arm to assess severity.  In the meantime, continue to use wrist splints during the day and night, especially on the right.  Start physical therapy for CTS.  If no improvement, she may benefit from local steroid injection to the wrist.   Bilateral leg paresthesia - resolved.  NCS/EMG of the legs was normal.   Thank you for allowing me to participate in patient's care.  If I can answer any additional  questions, I would be pleased to do so.    Sincerely,    Toney Difatta K. Allena Katz, DO

## 2018-03-17 NOTE — Progress Notes (Signed)
Agree it is difficult to get concise history. I heard her story about parasite in [place] but there is no record of stool studies in Epic. She told me she took some herbs.Generally responds well to steroids especially for exacerbation of headache.

## 2018-03-27 DIAGNOSIS — H40023 Open angle with borderline findings, high risk, bilateral: Secondary | ICD-10-CM | POA: Diagnosis not present

## 2018-04-08 ENCOUNTER — Other Ambulatory Visit: Payer: Self-pay | Admitting: Internal Medicine

## 2018-04-08 DIAGNOSIS — R7302 Impaired glucose tolerance (oral): Secondary | ICD-10-CM

## 2018-04-15 ENCOUNTER — Other Ambulatory Visit: Payer: Federal, State, Local not specified - PPO | Admitting: Internal Medicine

## 2018-04-15 DIAGNOSIS — R7302 Impaired glucose tolerance (oral): Secondary | ICD-10-CM

## 2018-04-16 LAB — HEMOGLOBIN A1C
EAG (MMOL/L): 7 (calc)
Hgb A1c MFr Bld: 6 % of total Hgb — ABNORMAL HIGH (ref <5.7)
Mean Plasma Glucose: 126 (calc)

## 2018-06-02 ENCOUNTER — Ambulatory Visit (HOSPITAL_COMMUNITY)
Admission: EM | Admit: 2018-06-02 | Discharge: 2018-06-02 | Disposition: A | Discharge status: Home / Self Care | Payer: Federal, State, Local not specified - PPO | Attending: Family Medicine | Admitting: Family Medicine

## 2018-06-02 DIAGNOSIS — F418 Other specified anxiety disorders: Secondary | ICD-10-CM

## 2018-06-02 DIAGNOSIS — R109 Unspecified abdominal pain: Secondary | ICD-10-CM

## 2018-06-02 DIAGNOSIS — K589 Irritable bowel syndrome without diarrhea: Secondary | ICD-10-CM

## 2018-06-02 DIAGNOSIS — R064 Hyperventilation: Secondary | ICD-10-CM

## 2018-06-02 DIAGNOSIS — R0789 Other chest pain: Secondary | ICD-10-CM

## 2018-06-02 LAB — POCT I-STAT, CHEM 8
BUN: 14 mg/dL (ref 6–20)
CALCIUM ION: 1.18 mmol/L (ref 1.15–1.40)
CHLORIDE: 103 mmol/L (ref 98–111)
Creatinine, Ser: 0.8 mg/dL (ref 0.44–1.00)
GLUCOSE: 107 mg/dL — AB (ref 70–99)
HCT: 37 % (ref 36.0–46.0)
Hemoglobin: 12.6 g/dL (ref 12.0–15.0)
Potassium: 4 mmol/L (ref 3.5–5.1)
Sodium: 142 mmol/L (ref 135–145)
TCO2: 28 mmol/L (ref 22–32)

## 2018-06-02 NOTE — ED Provider Notes (Signed)
[facility]    CSN: [id number] Arrival date & time: 06/02/18  1803     History   Chief Complaint No chief complaint on file.   HPI Jordan Carrillo is a 57 y.o. female.   Patient complains of abdominal and chest discomfort, feeling anxious paresthesias in her hands.  She seems to be an anxious person and has lots of worries and concerns and stresses in her life.  As regards cardiac risk factors she is menopausal but has no other risk factors.  She does have a history of GERD but that has been controlled with a more "alkaline" diet.  HPI  Past Medical History:  Diagnosis Date  . Bilateral carpal tunnel syndrome   . Cervical radiculopathy   . Hyperlipidemia   . Lumbar radiculopathy   . Migraine   . Ulcer     Patient Active Problem List   Diagnosis Date Noted  . Injury of left shoulder 05/10/2016  . Plantar fasciitis of right foot 06/15/2014  . Metatarsal deformity 06/15/2014  . Lung mass 09/18/2013  . History of migraine headaches 07/31/2013  . Breast disease 04/21/2013  . Anxiety 08/07/2012  . Depression 09/08/2011  . Bilateral carpal tunnel syndrome 09/08/2011  . Persistent headaches 09/08/2011  . Chondromalacia of right knee 09/08/2011  . Tendinosis 09/08/2011  . Fibromyalgia syndrome 09/08/2011  . Cervical radiculopathy 09/08/2011  . Lumbar radiculopathy 09/08/2011  . Crohn's disease (HCC) 12/28/2009  . RECTAL BLEEDING 10/28/2009  . INTERNAL HEMORRHOIDS WITHOUT MENTION COMP 10/22/2008  . ESOPHAGEAL REFLUX 10/22/2008    Past Surgical History:  Procedure Laterality Date  . NO PAST SURGERIES      OB History   None      Home Medications    Prior to Admission medications   Medication Sig Start Date End Date Taking? Authorizing Provider  meclizine (ANTIVERT) 25 MG tablet Take 1 tablet (25 mg total) by mouth 3 (three) times daily as needed for dizziness. 01/30/18   Margaree Mackintosh, MD  OVER THE COUNTER MEDICATION Vitamin d with K2    [provider]    Family History Family History  Problem Relation Age of Onset  . Uterine cancer Mother   . Bone cancer Father   . Kidney cancer Father   . Diabetes Brother     Social History Social History   Tobacco Use  . Smoking status: Never Smoker  . Smokeless tobacco: Never Used  Substance Use Topics  . Alcohol use: No    Alcohol/week: 0.0 oz  . Drug use: No     Allergies   Prednisone and Latex   Review of Systems Review of Systems  Constitutional: Negative.   HENT: Negative.   Respiratory: Negative.   Cardiovascular: Positive for chest pain.  Gastrointestinal: Positive for abdominal pain.  Genitourinary: Negative.   Musculoskeletal: Negative.   Neurological: Positive for weakness.  Hematological: Negative.   Psychiatric/Behavioral: The patient is nervous/anxious.      Physical Exam Triage Vital Signs ED Triage Vitals [06/02/18 1817]  Enc Vitals Group     BP 118/74     Pulse Rate (!) 104     Resp 18     Temp 99.1 F (37.3 C)     Temp Source Oral     SpO2 99 %     Weight      Height      Head Circumference      Peak Flow      Pain Score  Pain Loc      Pain Edu?      Excl. in GC?    No data found.  Updated Vital Signs BP 118/74 (BP Location: Left Arm)   Pulse (!) 104   Temp 99.1 F (37.3 C) (Oral)   Resp 18   LMP 01/18/2016 Comment: irregular periods  SpO2 99%   Visual Acuity Right Eye Distance:   Left Eye Distance:   Bilateral Distance:    Right Eye Near:   Left Eye Near:    Bilateral Near:     Physical Exam  Constitutional: She appears well-developed and well-nourished.  HENT:  Head: Normocephalic.  Eyes: Pupils are equal, round, and reactive to light.  Cardiovascular: Normal rate, regular rhythm, normal heart sounds and intact distal pulses.  Pulmonary/Chest: Effort normal and breath sounds normal.  Abdominal: Soft. There is no tenderness. There is no guarding.     UC Treatments / Results  Labs (all labs  ordered are listed, but only abnormal results are displayed) Labs Reviewed - No data to display  EKG None  Radiology No results found.  Procedures Procedures (including critical care time)  Medications Ordered in UC Medications - No data to display  Initial Impression / Assessment and Plan / UC Course  I have reviewed the triage vital signs and the nursing notes.  Pertinent labs & imaging results that were available during my care of the patient were reviewed by me and considered in my medical decision making (see chart for details).     Anxiety with hyperventilation Possible irritable bowel syndrome involving stomach and esophagus Final Clinical Impressions(s) / UC Diagnoses   Final diagnoses:  None   Discharge Instructions   None    ED Prescriptions    None     Controlled Substance Prescriptions [place] Controlled Substance Registry consulted? No   Frederica Kuster, MD 06/02/18 Norberta Keens

## 2018-06-02 NOTE — ED Triage Notes (Signed)
Seen by provider prior to triage 

## 2018-07-30 DIAGNOSIS — F419 Anxiety disorder, unspecified: Secondary | ICD-10-CM | POA: Diagnosis not present

## 2018-07-30 DIAGNOSIS — M791 Myalgia, unspecified site: Secondary | ICD-10-CM | POA: Diagnosis not present

## 2018-07-30 DIAGNOSIS — R42 Dizziness and giddiness: Secondary | ICD-10-CM | POA: Diagnosis not present

## 2018-07-30 DIAGNOSIS — R5383 Other fatigue: Secondary | ICD-10-CM | POA: Diagnosis not present

## 2018-07-30 DIAGNOSIS — F439 Reaction to severe stress, unspecified: Secondary | ICD-10-CM | POA: Diagnosis not present

## 2018-07-30 DIAGNOSIS — E559 Vitamin D deficiency, unspecified: Secondary | ICD-10-CM | POA: Diagnosis not present

## 2018-08-13 DIAGNOSIS — E0590 Thyrotoxicosis, unspecified without thyrotoxic crisis or storm: Secondary | ICD-10-CM | POA: Diagnosis not present

## 2018-08-13 DIAGNOSIS — K5010 Crohn's disease of large intestine without complications: Secondary | ICD-10-CM | POA: Diagnosis not present

## 2018-08-13 DIAGNOSIS — K219 Gastro-esophageal reflux disease without esophagitis: Secondary | ICD-10-CM | POA: Diagnosis not present

## 2018-08-13 DIAGNOSIS — R7989 Other specified abnormal findings of blood chemistry: Secondary | ICD-10-CM | POA: Diagnosis not present

## 2018-08-20 DIAGNOSIS — H31023 Solar retinopathy, bilateral: Secondary | ICD-10-CM | POA: Diagnosis not present

## 2018-08-20 DIAGNOSIS — H2513 Age-related nuclear cataract, bilateral: Secondary | ICD-10-CM | POA: Diagnosis not present

## 2018-08-21 DIAGNOSIS — E0590 Thyrotoxicosis, unspecified without thyrotoxic crisis or storm: Secondary | ICD-10-CM | POA: Diagnosis not present

## 2018-08-21 DIAGNOSIS — R7989 Other specified abnormal findings of blood chemistry: Secondary | ICD-10-CM | POA: Diagnosis not present

## 2018-08-25 ENCOUNTER — Ambulatory Visit: Payer: Federal, State, Local not specified - PPO | Admitting: Internal Medicine

## 2018-08-25 ENCOUNTER — Encounter: Payer: Self-pay | Admitting: Internal Medicine

## 2018-08-25 VITALS — BP 120/70 | HR 78 | Temp 98.3°F | Ht 64.0 in | Wt 137.0 lb

## 2018-08-25 DIAGNOSIS — F411 Generalized anxiety disorder: Secondary | ICD-10-CM

## 2018-08-25 DIAGNOSIS — R7302 Impaired glucose tolerance (oral): Secondary | ICD-10-CM

## 2018-08-25 DIAGNOSIS — F439 Reaction to severe stress, unspecified: Secondary | ICD-10-CM

## 2018-08-25 DIAGNOSIS — R5383 Other fatigue: Secondary | ICD-10-CM

## 2018-08-25 DIAGNOSIS — F339 Major depressive disorder, recurrent, unspecified: Secondary | ICD-10-CM

## 2018-08-25 DIAGNOSIS — R7989 Other specified abnormal findings of blood chemistry: Secondary | ICD-10-CM | POA: Diagnosis not present

## 2018-08-25 DIAGNOSIS — E7800 Pure hypercholesterolemia, unspecified: Secondary | ICD-10-CM

## 2018-08-25 DIAGNOSIS — G8929 Other chronic pain: Secondary | ICD-10-CM

## 2018-08-25 DIAGNOSIS — Z8719 Personal history of other diseases of the digestive system: Secondary | ICD-10-CM | POA: Diagnosis not present

## 2018-08-25 DIAGNOSIS — M7918 Myalgia, other site: Secondary | ICD-10-CM
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
## 2018-08-26 LAB — CBC WITH DIFFERENTIAL/PLATELET
Basophils Absolute: 41 cells/uL (ref 0–200)
Basophils Relative: 0.4 %
EOS PCT: 2.6 %
Eosinophils Absolute: 265 cells/uL (ref 15–500)
HCT: 37.1 % (ref 35.0–45.0)
HEMOGLOBIN: 12.7 g/dL (ref 11.7–15.5)
LYMPHS ABS: 2836 {cells}/uL (ref 850–3900)
MCH: 30.5 pg (ref 27.0–33.0)
MCHC: 34.2 g/dL (ref 32.0–36.0)
MCV: 89.2 fL (ref 80.0–100.0)
MONOS PCT: 6.8 %
MPV: 10.1 fL (ref 7.5–12.5)
NEUTROS ABS: 6365 {cells}/uL (ref 1500–7800)
Neutrophils Relative %: 62.4 %
Platelets: 398 10*3/uL (ref 140–400)
RBC: 4.16 10*6/uL (ref 3.80–5.10)
RDW: 11.9 % (ref 11.0–15.0)
Total Lymphocyte: 27.8 %
WBC mixed population: 694 cells/uL (ref 200–950)
WBC: 10.2 10*3/uL (ref 3.8–10.8)

## 2018-08-26 LAB — COMPLETE METABOLIC PANEL WITH GFR
AG RATIO: 1.2 (calc) (ref 1.0–2.5)
ALKALINE PHOSPHATASE (APISO): 68 U/L (ref 33–130)
ALT: 15 U/L (ref 6–29)
AST: 20 U/L (ref 10–35)
Albumin: 4.1 g/dL (ref 3.6–5.1)
BILIRUBIN TOTAL: 0.3 mg/dL (ref 0.2–1.2)
BUN: 22 mg/dL (ref 7–25)
CHLORIDE: 103 mmol/L (ref 98–110)
CO2: 24 mmol/L (ref 20–32)
Calcium: 9.5 mg/dL (ref 8.6–10.4)
Creat: 0.8 mg/dL (ref 0.50–1.05)
GFR, EST AFRICAN AMERICAN: 97 mL/min/{1.73_m2} (ref >=60)
GFR, Est Non African American: 84 mL/min/{1.73_m2} (ref >=60)
GLUCOSE: 95 mg/dL (ref 65–99)
Globulin: 3.3 g/dL (calc) (ref 1.9–3.7)
POTASSIUM: 4.5 mmol/L (ref 3.5–5.3)
Sodium: 138 mmol/L (ref 135–146)
Total Protein: 7.4 g/dL (ref 6.1–8.1)

## 2018-08-26 LAB — TSH: TSH: 0.02 m[IU]/L — AB

## 2018-08-26 LAB — FOLATE: FOLATE: 14.6 ng/mL

## 2018-08-26 LAB — VITAMIN B12: VITAMIN B 12: 856 pg/mL (ref 200–1100)

## 2018-08-26 LAB — VITAMIN D 25 HYDROXY (VIT D DEFICIENCY, FRACTURES): Vit D, 25-Hydroxy: 63 ng/mL (ref 30–100)

## 2018-08-28 ENCOUNTER — Other Ambulatory Visit: Payer: Self-pay | Admitting: Internal Medicine

## 2018-08-28 DIAGNOSIS — E039 Hypothyroidism, unspecified: Secondary | ICD-10-CM

## 2018-08-28 DIAGNOSIS — E0590 Thyrotoxicosis, unspecified without thyrotoxic crisis or storm: Secondary | ICD-10-CM | POA: Diagnosis not present

## 2018-08-28 DIAGNOSIS — R7989 Other specified abnormal findings of blood chemistry: Secondary | ICD-10-CM | POA: Diagnosis not present

## 2018-08-29 ENCOUNTER — Encounter: Payer: Self-pay | Admitting: Internal Medicine

## 2018-08-29 ENCOUNTER — Other Ambulatory Visit: Payer: Federal, State, Local not specified - PPO | Admitting: Internal Medicine

## 2018-08-29 DIAGNOSIS — E039 Hypothyroidism, unspecified: Secondary | ICD-10-CM

## 2018-08-29 LAB — TSH: TSH: 0.01 mIU/L — ABNORMAL LOW

## 2018-08-29 LAB — T4, FREE: Free T4: 1.7 ng/dL (ref 0.8–1.8)

## 2018-09-02 ENCOUNTER — Ambulatory Visit
Admission: RE | Admit: 2018-09-02 | Discharge: 2018-09-02 | Disposition: A | Discharge status: Home / Self Care | Payer: Federal, State, Local not specified - PPO | Source: Ambulatory Visit | Attending: Internal Medicine | Admitting: Internal Medicine

## 2018-09-02 DIAGNOSIS — E042 Nontoxic multinodular goiter: Secondary | ICD-10-CM | POA: Diagnosis not present

## 2018-09-02 DIAGNOSIS — E041 Nontoxic single thyroid nodule: Secondary | ICD-10-CM

## 2018-09-11 NOTE — Patient Instructions (Signed)
To be referred to endocrinologist for low TSH.

## 2018-09-12 ENCOUNTER — Other Ambulatory Visit: Payer: Self-pay | Admitting: Internal Medicine

## 2018-09-12 DIAGNOSIS — E0590 Thyrotoxicosis, unspecified without thyrotoxic crisis or storm: Secondary | ICD-10-CM

## 2018-09-16 ENCOUNTER — Other Ambulatory Visit: Payer: Self-pay | Admitting: Internal Medicine

## 2018-09-16 DIAGNOSIS — E7800 Pure hypercholesterolemia, unspecified: Secondary | ICD-10-CM

## 2018-09-16 DIAGNOSIS — G5603 Carpal tunnel syndrome, bilateral upper limbs: Secondary | ICD-10-CM

## 2018-09-16 DIAGNOSIS — R7302 Impaired glucose tolerance (oral): Secondary | ICD-10-CM

## 2018-09-16 DIAGNOSIS — M7918 Myalgia, other site: Secondary | ICD-10-CM

## 2018-09-16 DIAGNOSIS — G8929 Other chronic pain: Secondary | ICD-10-CM

## 2018-09-16 DIAGNOSIS — Z8719 Personal history of other diseases of the digestive system: Secondary | ICD-10-CM

## 2018-09-16 DIAGNOSIS — Z0000 Encounter for general adult medical examination without abnormal findings: Secondary | ICD-10-CM

## 2018-09-16 DIAGNOSIS — R5383 Other fatigue: Secondary | ICD-10-CM

## 2018-09-16 DIAGNOSIS — E0590 Thyrotoxicosis, unspecified without thyrotoxic crisis or storm: Secondary | ICD-10-CM | POA: Diagnosis not present

## 2018-09-16 DIAGNOSIS — F439 Reaction to severe stress, unspecified: Secondary | ICD-10-CM

## 2018-09-16 DIAGNOSIS — E039 Hypothyroidism, unspecified: Secondary | ICD-10-CM

## 2018-09-16 DIAGNOSIS — R7989 Other specified abnormal findings of blood chemistry: Secondary | ICD-10-CM | POA: Diagnosis not present

## 2018-09-16 DIAGNOSIS — F339 Major depressive disorder, recurrent, unspecified: Secondary | ICD-10-CM

## 2018-09-18 ENCOUNTER — Other Ambulatory Visit: Payer: Self-pay | Admitting: Internal Medicine

## 2018-09-18 DIAGNOSIS — K5010 Crohn's disease of large intestine without complications: Secondary | ICD-10-CM | POA: Diagnosis not present

## 2018-09-18 DIAGNOSIS — K633 Ulcer of intestine: Secondary | ICD-10-CM | POA: Diagnosis not present

## 2018-09-18 DIAGNOSIS — Z1211 Encounter for screening for malignant neoplasm of colon: Secondary | ICD-10-CM | POA: Diagnosis not present

## 2018-09-19 ENCOUNTER — Other Ambulatory Visit: Payer: Federal, State, Local not specified - PPO | Admitting: Internal Medicine

## 2018-09-19 ENCOUNTER — Encounter: Payer: Self-pay | Admitting: Internal Medicine

## 2018-09-19 DIAGNOSIS — E039 Hypothyroidism, unspecified: Secondary | ICD-10-CM

## 2018-09-19 DIAGNOSIS — M7918 Myalgia, other site: Secondary | ICD-10-CM

## 2018-09-19 DIAGNOSIS — F339 Major depressive disorder, recurrent, unspecified: Secondary | ICD-10-CM

## 2018-09-19 DIAGNOSIS — E7800 Pure hypercholesterolemia, unspecified: Secondary | ICD-10-CM

## 2018-09-19 DIAGNOSIS — R5383 Other fatigue: Secondary | ICD-10-CM | POA: Diagnosis not present

## 2018-09-19 DIAGNOSIS — F439 Reaction to severe stress, unspecified: Secondary | ICD-10-CM

## 2018-09-19 DIAGNOSIS — G8929 Other chronic pain: Secondary | ICD-10-CM

## 2018-09-19 DIAGNOSIS — Z0000 Encounter for general adult medical examination without abnormal findings: Secondary | ICD-10-CM

## 2018-09-19 DIAGNOSIS — R7302 Impaired glucose tolerance (oral): Secondary | ICD-10-CM | POA: Diagnosis not present

## 2018-09-19 DIAGNOSIS — G5603 Carpal tunnel syndrome, bilateral upper limbs: Secondary | ICD-10-CM

## 2018-09-19 DIAGNOSIS — Z8719 Personal history of other diseases of the digestive system: Secondary | ICD-10-CM

## 2018-09-20 LAB — CBC WITH DIFFERENTIAL/PLATELET
BASOS ABS: 39 {cells}/uL (ref 0–200)
Basophils Relative: 0.4 %
Eosinophils Absolute: 213 cells/uL (ref 15–500)
Eosinophils Relative: 2.2 %
HEMATOCRIT: 35.8 % (ref 35.0–45.0)
HEMOGLOBIN: 12 g/dL (ref 11.7–15.5)
LYMPHS ABS: 2852 {cells}/uL (ref 850–3900)
MCH: 30.7 pg (ref 27.0–33.0)
MCHC: 33.5 g/dL (ref 32.0–36.0)
MCV: 91.6 fL (ref 80.0–100.0)
MPV: 9.8 fL (ref 7.5–12.5)
Monocytes Relative: 5.7 %
NEUTROS PCT: 62.3 %
Neutro Abs: 6043 cells/uL (ref 1500–7800)
PLATELETS: 381 10*3/uL (ref 140–400)
RBC: 3.91 10*6/uL (ref 3.80–5.10)
RDW: 12 % (ref 11.0–15.0)
Total Lymphocyte: 29.4 %
WBC mixed population: 553 cells/uL (ref 200–950)
WBC: 9.7 10*3/uL (ref 3.8–10.8)

## 2018-09-20 LAB — COMPLETE METABOLIC PANEL WITH GFR
AG RATIO: 1.2 (calc) (ref 1.0–2.5)
ALBUMIN MSPROF: 3.7 g/dL (ref 3.6–5.1)
ALKALINE PHOSPHATASE (APISO): 84 U/L (ref 33–130)
ALT: 18 U/L (ref 6–29)
AST: 17 U/L (ref 10–35)
BUN: 11 mg/dL (ref 7–25)
CHLORIDE: 103 mmol/L (ref 98–110)
CO2: 27 mmol/L (ref 20–32)
Calcium: 9.1 mg/dL (ref 8.6–10.4)
Creat: 0.7 mg/dL (ref 0.50–1.05)
GFR, EST AFRICAN AMERICAN: 113 mL/min/{1.73_m2} (ref >=60)
GFR, Est Non African American: 98 mL/min/{1.73_m2} (ref >=60)
GLUCOSE: 82 mg/dL (ref 65–99)
Globulin: 3.1 g/dL (calc) (ref 1.9–3.7)
Potassium: 3.9 mmol/L (ref 3.5–5.3)
Sodium: 138 mmol/L (ref 135–146)
TOTAL PROTEIN: 6.8 g/dL (ref 6.1–8.1)
Total Bilirubin: 0.5 mg/dL (ref 0.2–1.2)

## 2018-09-20 LAB — LIPID PANEL
Cholesterol: 226 mg/dL — ABNORMAL HIGH (ref <200)
HDL: 48 mg/dL — AB (ref >50)
LDL CHOLESTEROL (CALC): 158 mg/dL — AB
NON-HDL CHOLESTEROL (CALC): 178 mg/dL — AB (ref <130)
TRIGLYCERIDES: 90 mg/dL (ref <150)
Total CHOL/HDL Ratio: 4.7 (calc) (ref <5.0)

## 2018-09-20 LAB — HEMOGLOBIN A1C
Hgb A1c MFr Bld: 6.1 % of total Hgb — ABNORMAL HIGH (ref <5.7)
MEAN PLASMA GLUCOSE: 128 (calc)
eAG (mmol/L): 7.1 (calc)

## 2018-09-20 LAB — MICROALBUMIN / CREATININE URINE RATIO
Creatinine, Urine: 75 mg/dL (ref 20–275)
MICROALB UR: 0.3 mg/dL
Microalb Creat Ratio: 4 mcg/mg creat (ref <30)

## 2018-09-20 LAB — VITAMIN D 25 HYDROXY (VIT D DEFICIENCY, FRACTURES): VIT D 25 HYDROXY: 59 ng/mL (ref 30–100)

## 2018-09-20 LAB — TSH: TSH: 0.03 mIU/L — ABNORMAL LOW

## 2018-09-25 ENCOUNTER — Ambulatory Visit (INDEPENDENT_AMBULATORY_CARE_PROVIDER_SITE_OTHER): Payer: Federal, State, Local not specified - PPO | Admitting: Internal Medicine

## 2018-09-25 ENCOUNTER — Encounter: Payer: Self-pay | Admitting: Internal Medicine

## 2018-09-25 VITALS — BP 102/60 | HR 77 | Temp 98.2°F | Ht 64.0 in | Wt 136.0 lb

## 2018-09-25 DIAGNOSIS — Z0000 Encounter for general adult medical examination without abnormal findings: Secondary | ICD-10-CM

## 2018-09-25 DIAGNOSIS — M7918 Myalgia, other site: Secondary | ICD-10-CM

## 2018-09-25 DIAGNOSIS — R5383 Other fatigue: Secondary | ICD-10-CM | POA: Diagnosis not present

## 2018-09-25 DIAGNOSIS — R7302 Impaired glucose tolerance (oral): Secondary | ICD-10-CM

## 2018-09-25 DIAGNOSIS — K529 Noninfective gastroenteritis and colitis, unspecified: Secondary | ICD-10-CM

## 2018-09-25 DIAGNOSIS — R7989 Other specified abnormal findings of blood chemistry: Secondary | ICD-10-CM

## 2018-09-25 DIAGNOSIS — G5603 Carpal tunnel syndrome, bilateral upper limbs: Secondary | ICD-10-CM

## 2018-09-25 DIAGNOSIS — E7800 Pure hypercholesterolemia, unspecified: Secondary | ICD-10-CM

## 2018-09-25 DIAGNOSIS — G8929 Other chronic pain: Secondary | ICD-10-CM

## 2018-09-25 DIAGNOSIS — R200 Anesthesia of skin: Secondary | ICD-10-CM

## 2018-09-25 DIAGNOSIS — F339 Major depressive disorder, recurrent, unspecified: Secondary | ICD-10-CM | POA: Diagnosis not present

## 2018-09-25 DIAGNOSIS — F411 Generalized anxiety disorder: Secondary | ICD-10-CM

## 2018-09-25 DIAGNOSIS — Z8669 Personal history of other diseases of the nervous system and sense organs: Secondary | ICD-10-CM

## 2018-09-25 LAB — POCT URINALYSIS DIPSTICK
Appearance: NEGATIVE
BILIRUBIN UA: NEGATIVE
Glucose, UA: NEGATIVE
Ketones, UA: NEGATIVE
Leukocytes, UA: NEGATIVE
Nitrite, UA: NEGATIVE
Odor: NEGATIVE
PH UA: 6 (ref 5.0–8.0)
Protein, UA: NEGATIVE
SPEC GRAV UA: 1.015 (ref 1.010–1.025)
UROBILINOGEN UA: 0.2 U/dL

## 2018-10-07 DIAGNOSIS — K5090 Crohn's disease, unspecified, without complications: Secondary | ICD-10-CM | POA: Diagnosis not present

## 2018-10-07 DIAGNOSIS — R7989 Other specified abnormal findings of blood chemistry: Secondary | ICD-10-CM | POA: Diagnosis not present

## 2018-10-07 DIAGNOSIS — E0500 Thyrotoxicosis with diffuse goiter without thyrotoxic crisis or storm: Secondary | ICD-10-CM | POA: Diagnosis not present

## 2018-10-07 DIAGNOSIS — E0590 Thyrotoxicosis, unspecified without thyrotoxic crisis or storm: Secondary | ICD-10-CM | POA: Diagnosis not present
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
## 2018-10-11 NOTE — Patient Instructions (Signed)
We are making referral to endocrinology for low TSH.  Continue to see your gastroenterologist regarding inflammatory bowel disease.  Return in 6 months.  Watch diet.  Statin medication refused.

## 2018-10-14 DIAGNOSIS — K5010 Crohn's disease of large intestine without complications: Secondary | ICD-10-CM | POA: Diagnosis not present

## 2018-10-16 ENCOUNTER — Encounter: Payer: Self-pay | Admitting: Endocrinology

## 2018-10-28 DIAGNOSIS — Z1151 Encounter for screening for human papillomavirus (HPV): Secondary | ICD-10-CM | POA: Diagnosis not present

## 2018-10-28 DIAGNOSIS — Z1159 Encounter for screening for other viral diseases: Secondary | ICD-10-CM | POA: Diagnosis not present

## 2018-10-28 DIAGNOSIS — Z6823 Body mass index (BMI) 23.0-23.9, adult: Secondary | ICD-10-CM | POA: Diagnosis not present

## 2018-10-28 DIAGNOSIS — Z114 Encounter for screening for human immunodeficiency virus [HIV]: Secondary | ICD-10-CM | POA: Diagnosis not present

## 2018-10-28 DIAGNOSIS — Z01419 Encounter for gynecological examination (general) (routine) without abnormal findings: Secondary | ICD-10-CM | POA: Diagnosis not present

## 2018-10-28 DIAGNOSIS — Z113 Encounter for screening for infections with a predominantly sexual mode of transmission: Secondary | ICD-10-CM | POA: Diagnosis not present

## 2018-10-30 DIAGNOSIS — E0590 Thyrotoxicosis, unspecified without thyrotoxic crisis or storm: Secondary | ICD-10-CM | POA: Diagnosis not present

## 2018-10-30 DIAGNOSIS — R7989 Other specified abnormal findings of blood chemistry: Secondary | ICD-10-CM | POA: Diagnosis not present

## 2018-10-31 ENCOUNTER — Encounter: Payer: Self-pay | Admitting: Internal Medicine

## 2018-10-31 ENCOUNTER — Ambulatory Visit (INDEPENDENT_AMBULATORY_CARE_PROVIDER_SITE_OTHER): Payer: Federal, State, Local not specified - PPO | Admitting: Internal Medicine

## 2018-10-31 VITALS — BP 110/68 | HR 77 | Ht 64.0 in | Wt 131.0 lb

## 2018-10-31 DIAGNOSIS — R7989 Other specified abnormal findings of blood chemistry: Secondary | ICD-10-CM

## 2018-10-31 LAB — TSH: TSH: <0.01 u[IU]/mL — ABNORMAL LOW (ref 0.35–4.50)

## 2018-10-31 LAB — T4, FREE: Free T4: 1.36 ng/dL (ref 0.60–1.60)

## 2018-10-31 MED ORDER — METHIMAZOLE 5 MG PO TABS: 5.0000 mg | ORAL_TABLET | Freq: Every day | ORAL | 3 refills | Status: DC

## 2018-10-31 NOTE — Progress Notes (Signed)
Name: Jordan Carrillo Kipper  MRN/ DOB: [id number], 28-Sep-1961    Age/ Sex: 57 y.o., female    PCP: Margaree MackintoshBaxley, Mary J, MD   Reason for Endocrinology Evaluation: Low TSH      Date of Initial Endocrinology Evaluation: 10/31/2018     HPI: Ms. 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 female with a past medical history of Chron's disease. The patient presented for initial endocrinology clinic visit on 10/31/2018 for consultative assistance with her low TSH .   Pt was having a lot of fatigue, left eye bulging, palpitations and diarrhea in August, 2019 .  She presented to [facility] and was diagnosed through them with Graves' Disease in September, 2019.   She also started having excessive diarrhea with bleeding and was subsequently diagnosed with Crohn's disease in November, 2019  She did lose her friend,unexpectedlt in April, 2019 followed by work stress .   She is currently on steroid for her crohn's disease.  She has eye pain, irritation and tearing.  Her weight has been stable Denies local neck symptoms.  She does have palpitations, with anxiety and jittery sensation.    No FH of autoimmune disease  Works for post [employer]   HISTORY:  Past Medical History:  Past Medical History:  Diagnosis Date  . Bilateral carpal tunnel syndrome   . Cervical radiculopathy   . Hyperlipidemia   . Lumbar radiculopathy   . Migraine   . Ulcer    Past Surgical History:  Past Surgical History:  Procedure Laterality Date  . NO PAST SURGERIES        Social History:  reports that she has never smoked. She has never used smokeless tobacco. She reports that she does not drink alcohol or use drugs.  Family History: family history includes Bone cancer in her father; Diabetes in her brother; Kidney cancer in her father; Uterine cancer in her mother.   HOME MEDICATIONS: Current Outpatient Medications on File Prior to Visit  Medication Sig Dispense Refill  . APRISO 0.375 g 24 hr capsule Take  4 tablets by mouth daily.    . hydrocortisone (CORTENEMA) 100 MG/60ML enema Place 1 enema rectally at bedtime.    . meclizine (ANTIVERT) 25 MG tablet Take 1 tablet (25 mg total) by mouth 3 (three) times daily as needed for dizziness. 30 tablet 0  . mesalamine (ROWASA) 4 g enema Place 4 g rectally as needed.    Marland Kitchen. OVER THE COUNTER MEDICATION Vitamin d with K2     No current facility-administered medications on file prior to visit.       REVIEW OF SYSTEMS: A comprehensive ROS was conducted with the patient and is negative except as per HPI and below:  Review of Systems  Constitutional: Positive for malaise/fatigue.  HENT: Negative for congestion and sore throat.   Eyes: Positive for blurred vision. Negative for pain.  Respiratory: Negative for cough and shortness of breath.   Cardiovascular: Positive for palpitations. Negative for chest pain.  Gastrointestinal: Positive for diarrhea. Negative for nausea.  Genitourinary: Negative for frequency.  Musculoskeletal: Negative for neck pain.  Neurological: Positive for tremors.  Endo/Heme/Allergies: Negative for polydipsia.  Psychiatric/Behavioral: Positive for depression. The patient is nervous/anxious.        OBJECTIVE:  VS: BP 110/68 (BP Location: Left Arm, Patient Position: Sitting, Cuff Size: Normal)   Pulse 77   Ht 5\' 4"  (1.626 m)   Wt 131 lb (59.4 kg)   LMP 01/18/2016 Comment: irregular periods  BMI 22.49 kg/m  Wt Readings from Last 3 Encounters:  10/31/18 131 lb (59.4 kg)  09/25/18 136 lb 0.1 oz (61.7 kg)  08/25/18 137 lb (62.1 kg)     EXAM: General: Pt appears well and is in NAD  Hydration: Well-hydrated with moist mucous membranes and good skin turgor  Eyes: External eye exam with a stare, but no lid lag , no exophthalmos.  EOM intact.  PERRL.  Ears, Nose, Throat: Hearing: Grossly intact bilaterally Dental: Good dentition  Throat: Clear without mass, erythema or exudate  Neck: General: Supple without  adenopathy. Thyroid: Thyroid size normal.  No goiter or nodules appreciated. No thyroid bruit.  Lungs: Clear with good BS bilat with no rales, rhonchi, or wheezes  Heart: Auscultation: RRR.  Abdomen: Normoactive bowel sounds, soft, nontender, without masses or organomegaly palpable  Extremities:  BL LE: No pretibial edema normal ROM and strength.  Skin: Hair: Texture and amount normal with gender appropriate distribution Skin Inspection: No rashes. Skin Palpation: Skin temperature, texture, and thickness normal to palpation  Neuro: Cranial nerves: II - XII grossly intact  Motor: Normal strength throughout DTRs: 2+ and symmetric in UE without delay in relaxation phase  Mental Status: Judgment, insight: Intact Orientation: Oriented to time, place, and person Mood and affect: No depression, anxiety, or agitation     DATA REVIEWED:  Results for KAMYIA, THOMASON (MRN [id number]) as of 11/02/2018 21:44  Ref. Range 08/29/2018 12:01 09/19/2018 11:05 10/31/2018 16:20  TSH Latest Ref Range: 0.35 - 4.50 uIU/mL 0.01 (L) 0.03 (L) <0.01 Repeated and verified X2. (L)  Triiodothyronine (T3) Latest Ref Range: 76 - 181 ng/dL   829  F6,OZHY(QMVHQI) Latest Ref Range: 0.60 - 1.60 ng/dL 1.7  6.96    ASSESSMENT/PLAN/RECOMMENDATIONS:   1. Subclinical Graves' Disease:  - Pt is clinically hyperthyoid - We discussed D/D of graves' disease vs autonomous nodule (s), vs thyroiditis.  - We will proceed with treatment, even though bio-chemically, she does not fit the criteria for treatment but I will proceed with treatment based on her symptoms.   We discussed that graves' disease  is a result of an autoimmune condition involving the thyroid.   We discussed with pt the benefits of methimazole in the Tx of hyperthyroidism, as well as the possible side effects/complications of anti-thyroid drug Tx (specifically detailing the rare, but serious side effect of agranulocytosis). She was informed of need for regular  thyroid function monitoring while on methimazole to ensure appropriate dosage without over-treatment. As well, we discussed the possible side effects of methimazole including the chance of rash, the small chance of liver irritation/juandice and the <=1 in 300-400 chance of sudden onset agranulocytosis.  We discussed importance of going to ED promptly (and stopping methimazole) if shewere to develop significant fever with severe sore throat of other evidence of acute infection.      We extensively discussed the various treatment options for hyperthyroidism and Graves disease including ablation therapy with radioactive iodine versus antithyroid drug treatment versus surgical therapy.  We recommended to the patient that we felt, at this time, that thionamide therapy would be most optimal.  We discussed the various possible benefits versus side effects of the various therapies.   I carefully explained to the patient that one of the consequences of I-131 ablation treatment would likely be permanent hypothyroidism which would require long-term replacement therapy with LT4. She was also urged to have a regular eye exams due to graves' orbitopathy.   Medications : Methimazole 5 mg daily  Labs in 3  weeks       F/U in 6 weeks      Signed electronically by: Lyndle Herrlich, MD  [facility] Endocrinology  [facility] [address] [place], [place] [address] Phone: [phone] FAX: [phone]   CC: Margaree Mackintosh, MD [address] [place] [address] Phone: [phone] Fax: [phone]   Return to Endocrinology clinic as below: No future appointments.

## 2018-10-31 NOTE — Patient Instructions (Signed)
We recommend that you follow these hyperthyroidism instructions at home:  1) Take Methimazole 5 mg once a day  If you develop severe sore throat with high fevers OR develop unexplained yellowing of your skin, eyes, under your tongue, severe abdominal pain with nausea or vomiting --> then please get evaluated immediately.   3) Get repeat thyroid labs in 3 weeks .   It is ESSENTIAL to get follow-up labs to help avoid over or undertreatment of your hyperthyroidism - both of which can be dangerous to your health.

## 2018-11-02 LAB — T3: T3, Total: 153 ng/dL (ref 76–181)

## 2018-11-02 LAB — THYROTROPIN RECEPTOR AUTOABS: THYROTROPIN RECEPTOR AB: 50.6 % — AB (ref <=16.0)

## 2018-11-20 DIAGNOSIS — H35343 Macular cyst, hole, or pseudohole, bilateral: Secondary | ICD-10-CM | POA: Diagnosis not present

## 2018-11-20 DIAGNOSIS — R7989 Other specified abnormal findings of blood chemistry: Secondary | ICD-10-CM | POA: Diagnosis not present

## 2018-11-20 DIAGNOSIS — E0500 Thyrotoxicosis with diffuse goiter without thyrotoxic crisis or storm: Secondary | ICD-10-CM | POA: Diagnosis not present

## 2018-11-20 DIAGNOSIS — E0590 Thyrotoxicosis, unspecified without thyrotoxic crisis or storm: Secondary | ICD-10-CM | POA: Diagnosis not present

## 2018-12-04 DIAGNOSIS — E0500 Thyrotoxicosis with diffuse goiter without thyrotoxic crisis or storm: Secondary | ICD-10-CM | POA: Diagnosis not present

## 2018-12-08 DIAGNOSIS — R7989 Other specified abnormal findings of blood chemistry: Secondary | ICD-10-CM | POA: Diagnosis not present

## 2018-12-08 DIAGNOSIS — E0590 Thyrotoxicosis, unspecified without thyrotoxic crisis or storm: Secondary | ICD-10-CM | POA: Diagnosis not present

## 2018-12-09 DIAGNOSIS — E0590 Thyrotoxicosis, unspecified without thyrotoxic crisis or storm: Secondary | ICD-10-CM | POA: Diagnosis not present

## 2018-12-09 DIAGNOSIS — E0500 Thyrotoxicosis with diffuse goiter without thyrotoxic crisis or storm: Secondary | ICD-10-CM | POA: Diagnosis not present

## 2018-12-09 DIAGNOSIS — K5090 Crohn's disease, unspecified, without complications: Secondary | ICD-10-CM | POA: Diagnosis not present

## 2018-12-12 ENCOUNTER — Ambulatory Visit: Payer: Self-pay | Admitting: Internal Medicine

## 2018-12-18 ENCOUNTER — Ambulatory Visit (INDEPENDENT_AMBULATORY_CARE_PROVIDER_SITE_OTHER): Payer: Federal, State, Local not specified - PPO | Admitting: Internal Medicine

## 2018-12-18 ENCOUNTER — Encounter: Payer: Self-pay | Admitting: Internal Medicine

## 2018-12-18 VITALS — BP 122/80 | HR 83 | Ht 64.0 in | Wt 124.0 lb

## 2018-12-18 DIAGNOSIS — E0500 Thyrotoxicosis with diffuse goiter without thyrotoxic crisis or storm: Secondary | ICD-10-CM | POA: Diagnosis not present

## 2018-12-18 MED ORDER — METHIMAZOLE 10 MG PO TABS: 20.0000 mg | ORAL_TABLET | Freq: Every day | ORAL | 6 refills | Status: DC

## 2018-12-18 NOTE — Progress Notes (Signed)
Follow-up Visit   Date: 12/19/18    Jordan Carrillo MRN: [id number] DOB: 1961/01/02   Interim History: [facility] Carrillo is a 58 y.o. right-handed female with depression and fibromyalgia returning to the clinic with new complaints "black outs".  The patient was accompanied to the clinic by self.  History of present illness: She has been seeing Dr. Estella Husk at [facility] since 2009 for headaches and leg paresthesias.  She was unhappy that he offered medications for her symptoms but not a cure.  She is unsure of what testing has been done.  Upon review of Dr. Beryle Quant notes, she had NCS/EMG which showed bilateral CTS, worse on the right.  Her migraines and pain has been treated with topiramate, maxalt, and gabapentin. Today, she is here with complaints of numbness involving both thighs which started in August 2018.  She also has shooting and achy pain of the feet, which is worse at night.  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  Pain is always worse at the end of the day.  She also has right shoulder pain and reduced range of motion.    She is doing functional medication through [facility] and was diagnosed with Lyme disease and parasite infection.  She uses a lot of essential oils, which has helped significantly, but still does not feel "right".  She felt that someone was poisoning her with arsenic and later found to have parasite infection after arriving back from [place] (?).    Office visits and EMG reports from Dr.Runheim's office reviewed.  She was being followed for chronic neck, low back pain, headaches, and CTS.  Her last NCS/EMG 08/29/2017 showed mild bilateral CTS, mild sensorimotor polyneuropathy, and worsening multilevel chronic cervical and lumbosacral radiculopathies. She was offered ESI and declined.    Headaches medications:  Topiramate 100mg /d, maxalt 10mg , imitrex  100mg  Chronic pain medication:  Lyrica 100mg  TID, gabapentin 900mg /d  UPDATE 03/14/2018:  She is here for appointment because new complaints of numbness of the hands.  This has been ongoing for sometime and she had NCS/EMG of the arms from October 2018 which showed mild CTS.  She is compliant with using her wrist splints.  She also complains of dropping objects more frequently.  She is planning on putting her home on the market and moving back home to [place]. She also complains of right frozen shoulder and is seeing orthopeadics for this.  She no longer had shooting pain of the legs and feels this has improved with kidney detoxification.  UPDATE 12/18/2018:  She was diagnosed with Graves disease in September 2019 and Crohn's disease in August 2019.  She was found to have retinal disease and is followed by ophthalmology and is being referred to [facility].  Today, she is here for spells of "black out" moments, lasting a split second, and occurring 3-5 times per week.  During this episode, she is unable to see anything for very brief millisecond. She is able to hear and talk.  There is no loss of consciousness, she is not falling, and there is no confusion.  Over the past few weeks, she has started to see intermittent double vision with eye movement to the right. She has eye irritation.   Medications:  Current Outpatient Medications on File Prior to Visit  Medication Sig Dispense Refill  . APRISO 0.375 g 24 hr capsule Take 4 tablets by mouth daily.    Marland Kitchen  meclizine (ANTIVERT) 25 MG tablet Take 1 tablet (25 mg total) by mouth 3 (three) times daily as needed for dizziness. 30 tablet 0  . mesalamine (ROWASA) 4 g enema Place 4 g rectally as needed.    . methimazole (TAPAZOLE) 10 MG tablet Take 2 tablets (20 mg total) by mouth daily. 60 tablet 6  . OVER THE COUNTER MEDICATION Vitamin d with K2    . hydrocortisone (CORTENEMA) 100 MG/60ML enema Place 1 enema rectally at bedtime.     No current facility-administered  medications on file prior to visit.     Allergies:  Allergies  Allergen Reactions  . Prednisone Anxiety  . Latex Other (See Comments)    Review of Systems:  CONSTITUTIONAL: No fevers, chills, night sweats, or weight loss.  EYES: No visual changes or eye pain ENT: No hearing changes.  No history of nose bleeds.   RESPIRATORY: No cough, wheezing and shortness of breath.   CARDIOVASCULAR: Negative for chest pain, and palpitations.   GI: Negative for abdominal discomfort, blood in stools or black stools.  No recent change in bowel habits.   GU:  No history of incontinence.   MUSCLOSKELETAL: +history of joint pain +swelling.  +myalgias.   SKIN: Negative for lesions, rash, and itching.   ENDOCRINE: Negative for cold or heat intolerance, polydipsia or goiter.   PSYCH:  + depression or anxiety symptoms.   NEURO: As Above.   Vital Signs:  BP 122/88   Pulse 79   Resp 14   Ht 5\' 4"  (1.626 m)   Wt 122 lb (55.3 kg)   LMP 01/18/2016 Comment: irregular periods  SpO2 98%   BMI 20.94 kg/m    General Medical Exam:   General:  Well appearing, comfortable  Eyes/ENT: see cranial nerve examination.  Right eye is injected, mild right ptosis Neck: No masses appreciated.  Full range of motion without tenderness.  No carotid bruits. Respiratory:  Clear to auscultation, good air entry bilaterally.   Cardiac:  Regular rate and rhythm, no murmur.   Ext:  No edema  Neurological Exam: MENTAL STATUS including orientation to time, place, person, recent and remote memory, attention span and concentration, language, and fund of knowledge is normal.  Speech is not dysarthric.  CRANIAL NERVES: No visual field defects. Pupils equal round and reactive to light.  Normal conjugate, extra-ocular eye movements in all directions of gaze, except trace weakness with right lateral gaze.  Mild right ptosis.  Face is symmetric. Palate elevates symmetrically.  Tongue is midline.  MOTOR:  Motor strength is 5/5 in all  extremities. No pronator drift.  Tone is normal.    MSRs:  Reflexes are 2+/4 throughout.  SENSORY:  Vibration intact throughout.   COORDINATION/GAIT:  Gait narrow based and stable.   Data: NCS/EMG of the legs 01/28/2018:  This is a normal study of the lower extremities. In particular, there is no evidence of a sensorimotor polyneuropathy or lumbosacral radiculopathy.  MRI brain 02/19/2016:  Negative MRI the brain. No acute or focal lesion to explain the patient's headaches.   IMPRESSION/PLAN: Spells of bilateral visual loss and dizziness, with retained consciousness.  - MRI/A head wwo contrast  - US carotids  - Routine EEG given stereotyped spells although suspicion for seizure is very low  Right eye pain, ptosis, and double vision.  This is most likely associated with pathology intrinsic to the eye and Grave's disease and I will try to get her notes from her ophthalmologist.  Unlikely myasthenia as this  would not manifest with eye pain or irritation.    Further recommendations pending results.   Thank you for allowing me to participate in patient's care.  If I can answer any additional questions, I would be pleased to do so.    Sincerely,    Dillan Candela K. Allena KatzPatel, DO

## 2018-12-18 NOTE — Progress Notes (Signed)
Name: Jordan Carrillo  MRN/ DOB: [id number], 09/04/1961    Age/ Sex: 58 y.o., female     PCP: Margaree MackintoshBaxley, Mary J, MD   Reason for Endocrinology Evaluation: Low TSH      Initial Endocrinology Clinic Visit: 10/31/18    PATIENT IDENTIFIER: Jordan Carrillo is a 58 y.o., female with a past medical history of Chron's disease . She has followed with [facility] since 10/31/18 for consultative assistance with management of her low TSH .   HISTORICAL SUMMARY: The patient was first diagnosed with graves' disease through [facility] in September 2019, she was also diagnosed with Crohn's disease in August 2019.   SUBJECTIVE:   During last visit (10/31/18): Her TFTs at the time was consistent with subclinical hyperthyroidism, she was started on 5 mg of methimazole based on her hyperthyroid symptoms at the time.  Today (12/19/2018):  Ms. Larae GroomsDeodath is here for 6-week follow-up on her Graves' disease.  Apparently on her last visit here she had failed to tell me that she was on lithium protocol and naltrexone protocol.  [facility] and that is why her TFTs were consistent more with subclinical rather than overt hypothyroidism. Patient tells me today that when she started the 5 mg of methimazole she felt ill, she however was starting losing a lot of hair, she just did not feel right and and when she presented to [facility] she was advised by them to stop that methimazole and was restarted on naltrexone protocol.  Patient has been without methimazole for 3 weeks She continues to have eye pain irritation and tearing, she has been seeing [facility]ll [facility]Shah through [facility] and from there [facility]   She continues with weight loss, her diarrhea has resolved, she has hair loss but anxiety has been stable.  Denies palpitations.   ROS:  As per HPI.   HISTORY:  Past Medical History:  Past Medical History:  Diagnosis Date    . Bilateral carpal tunnel syndrome   . Cervical radiculopathy   . Hyperlipidemia   . Lumbar radiculopathy   . Migraine   . Ulcer    Past Surgical History:  Past Surgical History:  Procedure Laterality Date  . NO PAST SURGERIES      Social History:  reports that she has never smoked. She has never used smokeless tobacco. She reports that she does not drink alcohol or use drugs.  Family History: family history includes Bone cancer in her father; Diabetes in her brother; Kidney cancer in her father; Uterine cancer in her mother.   HOME MEDICATIONS: Allergies as of 12/18/2018      Reactions   Prednisone Anxiety   Latex Other (See Comments)      Medication List       Accurate as of December 18, 2018 11:59 PM. Always use your most recent med list.        hydrocortisone 100 MG/60ML enema Commonly known as:  CORTENEMA Place 1 enema rectally at bedtime.   meclizine 25 MG tablet Commonly known as:  ANTIVERT Take 1 tablet (25 mg total) by mouth 3 (three) times daily as needed for dizziness.   mesalamine 4 g enema Commonly known as:  ROWASA Place 4 g rectally as needed.   APRISO 0.375 g 24 hr capsule Generic drug:  mesalamine Take 4 tablets by mouth daily.   methimazole 10 MG tablet Commonly known as:  TAPAZOLE Take 2 tablets (20 mg total) by mouth daily.  OVER THE COUNTER MEDICATION Vitamin d with K2         OBJECTIVE:   PHYSICAL EXAM: VS: BP 122/80 (BP Location: Left Arm, Patient Position: Sitting, Cuff Size: Normal)   Pulse 83   Ht 5\' 4"  (1.626 m)   Wt 124 lb (56.2 kg)   LMP 01/18/2016 Comment: irregular periods  SpO2 95%   BMI 21.28 kg/m    EXAM: General: Pt appears well and is in NAD  Eyes: External eye exam shows proptosis R<L, chemosis and lid lag, patient has right medial deviation of the eye.   Neck: General: Supple without adenopathy. Thyroid: Thyroid size normal.  No goiter or nodules appreciated. No thyroid bruit.  Lungs: Clear with good BS  bilat with no rales, rhonchi, or wheezes  Heart: Auscultation: RRR.  Abdomen: Normoactive bowel sounds, soft, nontender, without masses or organomegaly palpable  Extremities:  BL LE: No pretibial edema normal ROM and strength.  Skin: Hair: Texture and amount normal with gender appropriate distribution Skin Inspection: No rashes. Skin Palpation: Skin temperature, texture, and thickness normal to palpation  Neuro: Cranial nerves: II - XII grossly intact  DTRs: 2+ and symmetric in UE without delay in relaxation phase  Mental Status: Judgment, insight: Intact Orientation: Oriented to time, place, and person Mood and affect: anxious     DATA REVIEWED: 11/20/2018   TSH <0.006 uIU/mL FT4 2.75 ng/dL    1/61/091/27/20  TSH < 6.0450.006 FT4 3.15 ng/dL (4.09-8.110.82-1.77) FT3 7.1 pg/mL (2-4.4)    Results for Jordan Carrillo (MRN [id number]) as of 12/19/2018 16:11  Ref. Range 12/18/2018 16:47  TSH Latest Ref Range: 0.35 - 4.50 uIU/mL <0.01 (L)  Triiodothyronine (T3) Latest Ref Range: 76 - 181 ng/dL 213150  Y8,MVHQ(IONGEXT4,Free(Direct) Latest Ref Range: 0.60 - 1.60 ng/dL 5.282.16 (H)   ASSESSMENT / PLAN / RECOMMENDATIONS:   1. Hyperthyroidism Secondary to Graves' Disease:    - This is a difficult care to treat as she believes in holistic medicine and her graves' disease treatment started prior to me seeing her through [facility] by putting her on Lithium and Naltrexone protocol that I was not aware of , hence her TFT's looked like she was subclinical.  - I have explained to her that naturopathic medicine is different then scientific  medicine and she can not mix both and she will need to decide which was to go with her management.  - Pt states she would like to restart Methimazole and will stop naltrexone protocol.  - She is now seeing another natural doctor for chron's dietary management.  - Pt is asking for a repeat TRAB levels, even though I explained to her this is not going to affect or change in her  management but she is insistent on it at this time.   - We discussed with pt the benefits of methimazole in the Tx of hyperthyroidism, as well as the possible side effects/complications of anti-thyroid drug Tx (specifically detailing the rare, but serious side effect of agranulocytosis). She was informed of need for regular thyroid function monitoring while on methimazole to ensure appropriate dosage without over-treatment. As well, we discussed the possible side effects of methimazole including the chance of rash, the small chance of liver irritation/juandice and the <=1 in 300-400 chance of sudden onset agranulocytosis.  We discussed importance of going to ED promptly (and stopping methimazole) if she were to develop significant fever with severe sore throat of other evidence of acute infection.    - We extensively discussed the  various treatment options for hyperthyroidism and Graves disease including ablation therapy with radioactive iodine versus antithyroid drug treatment versus surgical therapy.  I do not recommend RAI ablation at this time due to graves orbitopathy, as this could get worse, we discussed if necessary we could always put her on prednisone if we decide that RAI is the only way to go.      Medications   Methimazole 20 mg daily   2) Graves' Disease:   - Patient is following with Dr. Sherryll Burger at [facility] for graves' orbitopathy.     F/u 3 weeks    Signed electronically by: Lyndle Herrlich, MD  [facility] Endocrinology  [facility] [address] [place], [place] [address] Phone: [phone] FAX: [phone]      CC: Margaree Mackintosh, MD [address] [place] [address] Phone: [phone]  Fax: [phone]   Return to Endocrinology clinic as below: Future Appointments  Date Time Provider Department Center  12/22/2018  2:00 PM [facility] VASC US 1-JUL[facility]ET [facility]  12/26/2018  3:50 PM GI-315 MR 2 GI-315MRI  [facility] W[facility]  12/26/2018  4:30 PM GI-315 MR 2 GI-315MRI [facility]  12/29/2018  2:30 PM [facility] EEG TECH [facility] None  01/08/2019  3:00 PM Sireen Halk, Konrad Dolores, MD [facility] None

## 2018-12-18 NOTE — Patient Instructions (Signed)
We recommend that you follow these hyperthyroidism instructions at home:  1) Take Methimazole 20 mg once a day  If you develop severe sore throat with high fevers OR develop unexplained yellowing of your skin, eyes, under your tongue, severe abdominal pain with nausea or vomiting --> then please get evaluated immediately.

## 2018-12-19 ENCOUNTER — Ambulatory Visit (INDEPENDENT_AMBULATORY_CARE_PROVIDER_SITE_OTHER): Payer: Federal, State, Local not specified - PPO | Admitting: Neurology

## 2018-12-19 ENCOUNTER — Encounter: Payer: Self-pay | Admitting: Neurology

## 2018-12-19 ENCOUNTER — Telehealth: Payer: Self-pay | Admitting: Internal Medicine

## 2018-12-19 VITALS — BP 122/88 | HR 79 | Resp 14 | Ht 64.0 in | Wt 122.0 lb

## 2018-12-19 DIAGNOSIS — H02401 Unspecified ptosis of right eyelid: Secondary | ICD-10-CM | POA: Diagnosis not present

## 2018-12-19 DIAGNOSIS — H539 Unspecified visual disturbance: Secondary | ICD-10-CM

## 2018-12-19 DIAGNOSIS — H532 Diplopia: Secondary | ICD-10-CM | POA: Diagnosis not present

## 2018-12-19 DIAGNOSIS — R42 Dizziness and giddiness: Secondary | ICD-10-CM

## 2018-12-19 LAB — TSH: TSH: <0.01 u[IU]/mL — ABNORMAL LOW (ref 0.35–4.50)

## 2018-12-19 LAB — T4, FREE: Free T4: 2.16 ng/dL — ABNORMAL HIGH (ref 0.60–1.60)

## 2018-12-19 NOTE — Telephone Encounter (Signed)
Patient has requested that we send her most recent lab results to her via mail.  She requested address:   [address]  [place] [address]

## 2018-12-19 NOTE — Patient Instructions (Addendum)
1. US carotids. We have you scheduled for your Carotid Doppler on 12/22/2018 at 2:00pm. Please arrive 15 minutes prior and go to first floor radiology at [facility]. If this is not a good date/time please call [phone] to reschedule.   2. MRI/MRA brain. We have sent a referral to [facility] for your MRI and they will call you directly to schedule your appt. They are located at [address] [facility]. If you need to contact them directly please call [phone].  3. Routine EEG.  To prepare for your EEG: . Wash your hair the night before or the day of the test, but don't use any conditioners, hair creams, sprays or styling gels.  . Avoid anything with caffeine 6 hours before the test. . Take your usual medications unless instructed otherwise. If you're supposed to sleep during your EEG test, your doctor may ask you to sleep less or even avoid sleep entirely the night before your EEG. If you have trouble falling asleep for the test, you might be given a sedative to help you relax.   What to expect You'll feel little or no discomfort during an EEG. The electrodes don't transmit any sensations. They just record your brain waves. If you need to sleep during the EEG, you might be given a sedative beforehand to help you relax. During the test: . A technician measures your head and marks your scalp with a type of pencil, to indicate where to attach the electrodes. Those spots on your scalp may be scrubbed with a gritty cream to improve the quality of the recording.  . A technician attaches flat metal discs (electrodes) to your scalp using a special adhesive. The electrodes are connected with wires to an instrument that amplifies - makes bigger - the brain waves and records them on computer equipment. Some people wear and elastic cap fitted with electrodes, instead of having the adhesive applied to their scalps. Once the electrodes are in place, an EEG typically takes 30-60 minutes.  . You relax in  a comfortable position with your eyes closed during the test. At various times, the technician may ask you to open and close your eyes, perform a few simple calculations, read a paragraph, look at a picture, breathe deeply (hyperventilate) for a few minutes, or look at a flashing light. .  After the test After the test, the technician removes the electrodes or cap. If no sedative was given, you should feel no side effects after the procedure, and you can return to your normal routine.  If you used a sedative, it may take about an hour to partially recover from the medication. You'll need someone to take you home because it can take up to a day for the full effects of the sedative to wear off. Rest and don't drive for the remainder of the day.    We will call you with the results

## 2018-12-19 NOTE — Telephone Encounter (Signed)
All of pt results from recent blood work has not came in as of yet but once it has all been resulted I will mail copy to pt at requested address

## 2018-12-20 LAB — TRAB (TSH RECEPTOR BINDING ANTIBODY): TRAB: 16.64 IU/L — ABNORMAL HIGH (ref <=2.00)

## 2018-12-20 LAB — T3: T3, Total: 150 ng/dL (ref 76–181)

## 2018-12-22 ENCOUNTER — Ambulatory Visit (HOSPITAL_COMMUNITY): Payer: Federal, State, Local not specified - PPO

## 2018-12-22 ENCOUNTER — Encounter: Payer: Self-pay | Admitting: Internal Medicine

## 2018-12-23 DIAGNOSIS — H4921 Sixth [abducent] nerve palsy, right eye: Secondary | ICD-10-CM | POA: Diagnosis not present

## 2018-12-25 DIAGNOSIS — H5021 Vertical strabismus, right eye: Secondary | ICD-10-CM | POA: Diagnosis not present

## 2018-12-25 DIAGNOSIS — H5203 Hypermetropia, bilateral: Secondary | ICD-10-CM | POA: Diagnosis not present

## 2018-12-25 DIAGNOSIS — H5032 Intermittent alternating esotropia: Secondary | ICD-10-CM | POA: Diagnosis not present

## 2018-12-25 DIAGNOSIS — E0590 Thyrotoxicosis, unspecified without thyrotoxic crisis or storm: Secondary | ICD-10-CM | POA: Diagnosis not present

## 2018-12-26 ENCOUNTER — Encounter: Payer: Self-pay | Admitting: *Deleted

## 2018-12-26 ENCOUNTER — Ambulatory Visit
Admission: RE | Admit: 2018-12-26 | Discharge: 2018-12-26 | Disposition: A | Discharge status: Home / Self Care | Payer: Federal, State, Local not specified - PPO | Source: Ambulatory Visit | Attending: Neurology | Admitting: Neurology

## 2018-12-26 ENCOUNTER — Telehealth: Payer: Self-pay

## 2018-12-26 ENCOUNTER — Telehealth: Payer: Self-pay | Admitting: *Deleted

## 2018-12-26 ENCOUNTER — Ambulatory Visit (HOSPITAL_COMMUNITY)
Admission: RE | Admit: 2018-12-26 | Discharge: 2018-12-26 | Disposition: A | Discharge status: Home / Self Care | Payer: Federal, State, Local not specified - PPO | Source: Ambulatory Visit | Attending: Neurology | Admitting: Neurology

## 2018-12-26 DIAGNOSIS — H539 Unspecified visual disturbance: Secondary | ICD-10-CM | POA: Insufficient documentation

## 2018-12-26 DIAGNOSIS — H5032 Intermittent alternating esotropia: Secondary | ICD-10-CM | POA: Diagnosis not present

## 2018-12-26 DIAGNOSIS — R42 Dizziness and giddiness: Secondary | ICD-10-CM

## 2018-12-26 DIAGNOSIS — H5021 Vertical strabismus, right eye: Secondary | ICD-10-CM | POA: Diagnosis not present

## 2018-12-26 DIAGNOSIS — E0590 Thyrotoxicosis, unspecified without thyrotoxic crisis or storm: Secondary | ICD-10-CM | POA: Diagnosis not present

## 2018-12-26 DIAGNOSIS — R0600 Dyspnea, unspecified: Secondary | ICD-10-CM | POA: Diagnosis not present

## 2018-12-26 MED ORDER — GADOBENATE DIMEGLUMINE 529 MG/ML IV SOLN
10.0000 mL | Freq: Once | INTRAVENOUS | Status: AC | PRN
  Administered 2018-12-26: 10 mL via INTRAVENOUS

## 2018-12-26 NOTE — Telephone Encounter (Signed)
-----   Message from Glendale Chard, DO sent at 12/26/2018  1:57 PM EST ----- Please inform patient that her carotid ultrasound looks good.  Thanks.

## 2018-12-26 NOTE — Progress Notes (Signed)
Carotid artery duplex has been completed. Preliminary results can be found in CV Proc through chart review.   12/26/18 11:38 AM Olen Cordial RVT

## 2018-12-26 NOTE — Telephone Encounter (Signed)
No. She will need to go to [facility] as I have not ordered the tests

## 2018-12-26 NOTE — Telephone Encounter (Signed)
Advised patient she will need to go to [facility] to have labs drawn since these were ordered by another physician and not our office.   Patient wanted to know if she could make an appointment to come and have Lyme test done.  Stated that her eye doctor thought it was a possibility with what is going on with her eyes.  Advised that if her doctor has ordered the test, she will need to go to the [facility] and have these labs drawn.  Patient verbalized understanding of our conversation.

## 2018-12-26 NOTE — Telephone Encounter (Signed)
Patient called states her ophthalmologist ordered some test for her and wants to know if she can come here to have it done? LYME and some other tests she said she can not read the script.

## 2018-12-26 NOTE — Telephone Encounter (Signed)
Results sent via My Chart.  

## 2018-12-29 ENCOUNTER — Ambulatory Visit (INDEPENDENT_AMBULATORY_CARE_PROVIDER_SITE_OTHER): Payer: Federal, State, Local not specified - PPO | Admitting: Neurology

## 2018-12-29 ENCOUNTER — Ambulatory Visit: Payer: Self-pay | Admitting: Neurology

## 2018-12-29 ENCOUNTER — Encounter: Payer: Self-pay | Admitting: *Deleted

## 2018-12-29 ENCOUNTER — Telehealth: Payer: Self-pay | Admitting: *Deleted

## 2018-12-29 DIAGNOSIS — H539 Unspecified visual disturbance: Secondary | ICD-10-CM

## 2018-12-29 DIAGNOSIS — R42 Dizziness and giddiness: Secondary | ICD-10-CM | POA: Diagnosis not present

## 2018-12-29 NOTE — Telephone Encounter (Signed)
-----   Message from Glendale Chard, DO sent at 12/29/2018  7:53 AM EST ----- Please inform pt that her US carotids, MRI brain, and vessels to the brain are all normal.  We will let her know about EEG results, once it has been completed. Thanks.

## 2018-12-29 NOTE — Telephone Encounter (Signed)
Results sent via My Chart.  

## 2018-12-30 NOTE — Procedures (Signed)
ELECTROENCEPHALOGRAM REPORT  Date of Study: 12/29/2018  Patient's Name: Jordan Carrillo MRN: [id number] Date of Birth: 1960/11/16  Referring Provider: Dr. Nita Sickle  Clinical History: This is a 58 year old woman with blackouts where she is unable to see anything for very brief milliseconds, intermittent double vision with eye movement to the right.  Medications: APRISO 0.375 g 24 hr capsule  ANTIVERT 25 MG tablet  ROWASA 4 g enema  TAPAZOLE 10 MG tablet  Vitamin d with K2   CORTENEMA 100 MG/60ML enema   Technical Summary: A multichannel digital EEG recording measured by the international 10-20 system with electrodes applied with paste and impedances below 5000 ohms performed in our laboratory with EKG monitoring in an awake and asleep patient.  Hyperventilation was not performed. Photic stimulation was performed.  The digital EEG was referentially recorded, reformatted, and digitally filtered in a variety of bipolar and referential montages for optimal display.    Description: The patient is awake and asleep during the recording.  During maximal wakefulness, there is a symmetric, medium voltage 10-10.5 Hz posterior dominant rhythm that attenuates with eye opening.  The record is symmetric.  During drowsiness and sleep, there is an increase in theta slowing of the background, at times sharply contoured over the bilateral temporal regions without clear epileptogenic potential.  Vertex waves and symmetric sleep spindles were seen.  Photic stimulation did not elicit any abnormalities.  There were no epileptiform discharges or electrographic seizures seen.    EKG lead was unremarkable.  Impression: This awake and asleep EEG is within normal limits.  Clinical Correlation: A normal EEG does not exclude a clinical diagnosis of epilepsy.  If further clinical questions remain, prolonged EEG may be helpful.  Clinical correlation is advised.   Patrcia Dolly, M.D.

## 2018-12-31 ENCOUNTER — Encounter: Payer: Self-pay | Admitting: *Deleted

## 2018-12-31 ENCOUNTER — Telehealth: Payer: Self-pay | Admitting: *Deleted

## 2018-12-31 NOTE — Telephone Encounter (Signed)
Results sent via My Chart.  

## 2018-12-31 NOTE — Telephone Encounter (Signed)
-----   Message from Glendale Chard, DO sent at 12/30/2018  2:56 PM EST ----- Please inform patient her EEG is normal.  With normal MRI brain, vessel imaging, and EEG, it is reassuring nothing worrisome is causing these symptoms.

## 2019-01-08 ENCOUNTER — Other Ambulatory Visit: Payer: Self-pay

## 2019-01-08 ENCOUNTER — Encounter: Payer: Self-pay | Admitting: Internal Medicine

## 2019-01-08 ENCOUNTER — Ambulatory Visit (INDEPENDENT_AMBULATORY_CARE_PROVIDER_SITE_OTHER): Payer: Federal, State, Local not specified - PPO | Admitting: Internal Medicine

## 2019-01-08 VITALS — BP 118/80 | HR 100 | Ht 64.0 in | Wt 118.8 lb

## 2019-01-08 DIAGNOSIS — E0500 Thyrotoxicosis with diffuse goiter without thyrotoxic crisis or storm: Secondary | ICD-10-CM | POA: Diagnosis not present

## 2019-01-08 LAB — T4, FREE: Free T4: 2.73 ng/dL — ABNORMAL HIGH (ref 0.60–1.60)

## 2019-01-08 LAB — TSH: TSH: <0.01 u[IU]/mL — ABNORMAL LOW (ref 0.35–4.50)

## 2019-01-08 NOTE — Progress Notes (Signed)
Name: Jordan Carrillo  MRN/ DOB: [id number], 1961-08-16    Age/ Sex: 58 y.o., female     PCP: Elby Showers, MD   Reason for Endocrinology Evaluation: Hyperthyroidism      Initial Endocrinology Clinic Visit: 10/31/18    PATIENT IDENTIFIER: Ms. Jordan Carrillo is a 59 y.o., female with a past medical history of Chron's disease . She has followed with [facility] since 10/31/18 for consultative assistance with management of her low TSH .   HISTORICAL SUMMARY: The patient was first diagnosed with graves' disease through [facility] in September 2019, she was also diagnosed with Crohn's disease in August 2019.  She believes in holistic medicine and her graves' disease treatment started prior to seeing me  through [facility] by putting her on Lithium and Naltrexone protocol .  Her initial TFTs were more consistent with subclinical hyperthyroidism but this is because she was already on lithium.  She eventually stopped all supplements and agreed to start on methimazole with a lot of reluctance on December 18, 2018  SUBJECTIVE:   During last visit (12/18/2018): Her TFTs at the time was consistent with hyperthyroidism.  She has stopped lithium and naltrexone protocol and agreed to start reluctantly on methimazole.   Today (01/09/2019):  Ms. [name] is here for 3-week follow-up on her Graves' disease.  Patient states she has been taking methimazole 20 mg daily except today she forgot to take it.  She believes that methimazole 20 mg is too much for her because she has not felt good at all.  Today she is c/o weight loss ~ 4 lbs since last visit. She does not feel well. She has headaches, last week she was throwing up , but the following up has resolved so far.   She denies any local neck symptoms She does however have pruritic rash on her upper and lower extremities. She saw the ophthalmologist "Dr. Annamaria Boots"   ROS:  As per HPI.   HISTORY:    Past Medical History:  Past Medical History:  Diagnosis Date  . Bilateral carpal tunnel syndrome   . Cervical radiculopathy   . Hyperlipidemia   . Lumbar radiculopathy   . Migraine   . Ulcer    Past Surgical History:  Past Surgical History:  Procedure Laterality Date  . NO PAST SURGERIES      Social History:  reports that she has never smoked. She has never used smokeless tobacco. She reports that she does not drink alcohol or use drugs.  Family History: family history includes Bone cancer in her father; Diabetes in her brother; Kidney cancer in her father; Uterine cancer in her mother.   HOME MEDICATIONS: Allergies as of 01/08/2019      Reactions   Prednisone Anxiety   Latex Other (See Comments)      Medication List       Accurate as of January 08, 2019 11:59 PM. Always use your most recent med list.        hydrocortisone 100 MG/60ML enema Commonly known as:  CORTENEMA Place 1 enema rectally at bedtime.   meclizine 25 MG tablet Commonly known as:  ANTIVERT Take 1 tablet (25 mg total) by mouth 3 (three) times daily as needed for dizziness.   mesalamine 4 g enema Commonly known as:  ROWASA Place 4 g rectally as needed.   APRISO 0.375 g 24 hr capsule Generic drug:  mesalamine Take 4 tablets by mouth daily.   methimazole 10 MG tablet Commonly  known as:  TAPAZOLE Take 2 tablets (20 mg total) by mouth daily.   OVER THE COUNTER MEDICATION Vitamin d with K2         OBJECTIVE:   PHYSICAL EXAM: VS: BP 118/80 (BP Location: Left Arm, Patient Position: Sitting, Cuff Size: Normal)   Pulse 100   Ht '5\' 4"'$  (1.626 m)   Wt 118 lb 12.8 oz (53.9 kg)   LMP 01/18/2016 Comment: irregular periods  SpO2 96%   BMI 20.39 kg/m    EXAM: General: Pt appears well and is in NAD  Eyes: External eye exam shows proptosis R<L, chemosis and lid lag, patient has right medial deviation of the eye.   Neck: General: Supple without adenopathy. Thyroid: Thyroid size normal.  No  goiter or nodules appreciated. No thyroid bruit.  Lungs: Clear with good BS bilat with no rales, rhonchi, or wheezes  Heart: Auscultation: RRR.  Abdomen: Normoactive bowel sounds, soft, nontender, without masses or organomegaly palpable  Extremities:  BL LE: No pretibial edema normal ROM and strength.  Skin: Hair: Texture and amount normal with gender appropriate distribution Skin Inspection: No rashes. Skin Palpation: Skin temperature, texture, and thickness normal to palpation  Neuro: Cranial nerves: II - XII grossly intact  DTRs: 2+ and symmetric in UE without delay in relaxation phase  Mental Status: Judgment, insight: Intact Orientation: Oriented to time, place, and person Mood and affect: anxious     DATA REVIEWED: 11/20/2018   TSH <0.006 uIU/mL FT4 2.75 ng/dL    12/08/18  TSH < 0.006 FT4 3.15 ng/dL (0.82-1.77) FT3 7.1 pg/mL (2-4.4)    Results for NIURKA, BENECKE (MRN [id number]) as of 12/19/2018 16:11  Ref. Range 12/18/2018 16:47  TSH Latest Ref Range: 0.35 - 4.50 uIU/mL <0.01 (L)  Triiodothyronine (T3) Latest Ref Range: 76 - 181 ng/dL 150  T4,Free(Direct) Latest Ref Range: 0.60 - 1.60 ng/dL 2.16 (H)   ASSESSMENT / PLAN / RECOMMENDATIONS:   1. Hyperthyroidism Secondary to Graves' Disease:    - Ms.Circle likes to use holistic ways in treating certain conditions, when I initially met her she was on lithium and naltrexone protocol, patient was still severely symptomatic with hyperthyroidism, she initially was reluctant to start methimazole, eventually she decided to proceed with taking methimazole and stop the lithium and naltrexone protocol since she did not see any improvement in her symptoms on them. -Patient states that she has been very compliant with methimazole, but did not take it today.  She assures me that she has not skipped any other methimazole dosing. -She has this pruritic rash on her upper and lower extremity, I have expressed my concern that this could be  an allergic reaction reaction to methimazole, we discussed other options of treatment such as PTU and surgical resection of the thyroid, I have expressed my concerns of proceeding with radioactive iodine due to her Graves' orbitopathy as this could get worse. -Patient does not want to consider PTU nor surgical intervention at this point, she also does not like the dose of 20 mg of methimazole, she believes this is too much for her.  I have explained to her that methimazole 20 mg based on her today's labs are not working at all, I have encouraged her to either start PTU or consider surgical intervention versus trying to increase her methimazole knowing that this could be the reason for her pruritic rash.   -Patient states she would like to think about this and get back with me about her final decision on how  to proceed.  -During her office visit we discussed her high risk for developing a thyroid storm.   Medications   Methimazole 20 mg daily   2) Graves' Disease:   - Patient is following with Dr. Manuella Ghazi at [facility] for graves' orbitopathy.     F/u 6 weeks  Labs in 3 weeks  Addendum: Lab results were discussed with the patient on 01/09/2019 at 9:15 AM.  We again discussed her other options of switching to PTU versus surgical intervention.  Patient would like to think about this and not give me an answer at this point.    Signed electronically by: Mack Guise, MD  [facility] Endocrinology  [facility] [address] [place], [place] [address] Phone: [phone] FAX: [phone]      CC: Elby Showers, MD [address] [address] Phone: [phone]  Fax: [phone]   Return to Endocrinology clinic as below: Future Appointments  Date Time Provider [place]  01/28/2019  2:00 PM [facility] None  02/18/2019  2:00 PM Shamleffer, Melanie Crazier, MD [facility] None

## 2019-01-09 ENCOUNTER — Telehealth: Payer: Self-pay | Admitting: Internal Medicine

## 2019-01-09 ENCOUNTER — Encounter: Payer: Self-pay | Admitting: Internal Medicine

## 2019-01-09 NOTE — Telephone Encounter (Signed)
Left a message to the patient for a call back to discuss lab results    Abby Raelyn Mora, MD  [facility] Endocrinology  [facility] [address] [place], [place] [address] Phone: [phone] FAX: [phone]

## 2019-01-13 ENCOUNTER — Encounter: Payer: Self-pay | Admitting: Internal Medicine

## 2019-01-13 ENCOUNTER — Other Ambulatory Visit: Payer: Federal, State, Local not specified - PPO | Admitting: Internal Medicine

## 2019-01-13 ENCOUNTER — Other Ambulatory Visit: Payer: Self-pay

## 2019-01-13 ENCOUNTER — Ambulatory Visit: Payer: Self-pay | Admitting: Internal Medicine

## 2019-01-13 ENCOUNTER — Ambulatory Visit (INDEPENDENT_AMBULATORY_CARE_PROVIDER_SITE_OTHER): Payer: Federal, State, Local not specified - PPO | Admitting: Internal Medicine

## 2019-01-13 VITALS — BP 90/60 | HR 90 | Ht 64.0 in | Wt 120.0 lb

## 2019-01-13 DIAGNOSIS — F32A Depression, unspecified: Secondary | ICD-10-CM

## 2019-01-13 DIAGNOSIS — F419 Anxiety disorder, unspecified: Secondary | ICD-10-CM

## 2019-01-13 DIAGNOSIS — R7989 Other specified abnormal findings of blood chemistry: Secondary | ICD-10-CM | POA: Diagnosis not present

## 2019-01-13 DIAGNOSIS — E7800 Pure hypercholesterolemia, unspecified: Secondary | ICD-10-CM

## 2019-01-13 DIAGNOSIS — K529 Noninfective gastroenteritis and colitis, unspecified: Secondary | ICD-10-CM

## 2019-01-13 DIAGNOSIS — R7302 Impaired glucose tolerance (oral): Secondary | ICD-10-CM | POA: Diagnosis not present

## 2019-01-13 DIAGNOSIS — F439 Reaction to severe stress, unspecified: Secondary | ICD-10-CM

## 2019-01-13 DIAGNOSIS — F329 Major depressive disorder, single episode, unspecified: Secondary | ICD-10-CM

## 2019-01-13 DIAGNOSIS — E0500 Thyrotoxicosis with diffuse goiter without thyrotoxic crisis or storm: Secondary | ICD-10-CM

## 2019-01-13 MED ORDER — SERTRALINE HCL 50 MG PO TABS: 50.0000 mg | ORAL_TABLET | Freq: Every day | ORAL | 0 refills | Status: DC

## 2019-01-14 LAB — HEMOGLOBIN A1C
Hgb A1c MFr Bld: 6.1 % of total Hgb — ABNORMAL HIGH (ref <5.7)
Mean Plasma Glucose: 128 (calc)
eAG (mmol/L): 7.1 (calc)

## 2019-01-14 LAB — LIPID PANEL
Cholesterol: 215 mg/dL — ABNORMAL HIGH (ref <200)
HDL: 46 mg/dL — ABNORMAL LOW (ref >=50)
LDL CHOLESTEROL (CALC): 150 mg/dL — AB
Non-HDL Cholesterol (Calc): 169 mg/dL (calc) — ABNORMAL HIGH (ref <130)
Total CHOL/HDL Ratio: 4.7 (calc) (ref <5.0)
Triglycerides: 82 mg/dL (ref <150)

## 2019-01-15 ENCOUNTER — Telehealth: Payer: Self-pay | Admitting: Internal Medicine

## 2019-01-15 NOTE — Telephone Encounter (Signed)
Jordan Carrillo Self [phone]  [name] called to say she could not go out of work when Dr Lenord Fellers wanted her t, because she would have no insurance. So she is going to work with HR to make arrangements for her insurance to cover while she is out. She will call back to let us know what she has worked out. She also stated that she was going to [employer] on 01/29/2019.

## 2019-01-26 ENCOUNTER — Other Ambulatory Visit: Payer: Self-pay | Admitting: Internal Medicine

## 2019-01-26 DIAGNOSIS — E0500 Thyrotoxicosis with diffuse goiter without thyrotoxic crisis or storm: Secondary | ICD-10-CM

## 2019-01-28 ENCOUNTER — Other Ambulatory Visit: Payer: Self-pay

## 2019-01-30 ENCOUNTER — Other Ambulatory Visit: Payer: Self-pay

## 2019-01-30 ENCOUNTER — Other Ambulatory Visit (INDEPENDENT_AMBULATORY_CARE_PROVIDER_SITE_OTHER): Payer: Federal, State, Local not specified - PPO

## 2019-01-30 DIAGNOSIS — E0500 Thyrotoxicosis with diffuse goiter without thyrotoxic crisis or storm: Secondary | ICD-10-CM

## 2019-01-30 LAB — TSH: TSH: <0.01 u[IU]/mL — ABNORMAL LOW (ref 0.35–4.50)

## 2019-01-30 LAB — T4, FREE: Free T4: 1.43 ng/dL (ref 0.60–1.60)

## 2019-02-03 ENCOUNTER — Encounter: Payer: Self-pay | Admitting: Internal Medicine

## 2019-02-03 ENCOUNTER — Ambulatory Visit (INDEPENDENT_AMBULATORY_CARE_PROVIDER_SITE_OTHER): Payer: Federal, State, Local not specified - PPO | Admitting: Internal Medicine

## 2019-02-03 ENCOUNTER — Other Ambulatory Visit: Payer: Self-pay

## 2019-02-03 VITALS — Ht 64.0 in

## 2019-02-03 DIAGNOSIS — E0500 Thyrotoxicosis with diffuse goiter without thyrotoxic crisis or storm: Secondary | ICD-10-CM

## 2019-02-03 DIAGNOSIS — F419 Anxiety disorder, unspecified: Secondary | ICD-10-CM

## 2019-02-03 DIAGNOSIS — F32A Depression, unspecified: Secondary | ICD-10-CM

## 2019-02-03 DIAGNOSIS — F329 Major depressive disorder, single episode, unspecified: Secondary | ICD-10-CM

## 2019-02-03 DIAGNOSIS — F439 Reaction to severe stress, unspecified: Secondary | ICD-10-CM

## 2019-02-03 MED ORDER — LORAZEPAM 1 MG PO TABS: ORAL_TABLET | ORAL | 2 refills | Status: DC

## 2019-02-06 ENCOUNTER — Telehealth: Payer: Self-pay | Admitting: Internal Medicine

## 2019-02-06 DIAGNOSIS — F419 Anxiety disorder, unspecified: Secondary | ICD-10-CM

## 2019-02-06 NOTE — Telephone Encounter (Signed)
Hazel dropped off FMLA forms to be filled out.

## 2019-02-07 NOTE — Progress Notes (Signed)
   Subjective:    Patient ID: Jordan Carrillo, female    DOB: 04-Apr-1961, 58 y.o.   MRN: [id number]  HPI 58 year old Female in today for follow-up on anxiety and depression.  Last seen March 3 and was clearly struggling with her work.  Has been diagnosed with hyperthyroidism by Endocrinologist.  Was reluctant at first to take traditional treatment with methimazole but says she has been taking it.  Her daughter has lost job in [place] and wants to move to [place].  She sent daughter money.  She is stressed about her finances.  She had talked about taking an early medical retirement from the [employer] but says now that her daughter is in need of financial assistance she would like to continue working.  However, she needs FMLA form completed.  She has Crohn's disease in addition to hypothyroidism.  At last visit she was placed on Zoloft 50 mg daily.  She has heard from a coworker that Ativan might help her sleep.  Does not feel that she is getting enough rest.  Finding it hard to go to sleep because of anxiety.  We will prescribe Ativan 1 mg 1/2-1 tab by mouth daily particularly before she goes to sleep.    Review of Systems see above-says she has follow-up with ophthalmologist regarding exophthalmos     Objective:   Physical Exam    Not examined today but I spent 20 minutes speaking with her about these issues in addition to anxiety depression and Graves' disease issues regarding compliance with methimazole        Assessment & Plan:  Anxiety depression  Situational stress-still seems conflicted about job, job stress and issues with daughter.  Does not want to go to counseling.  Graves' disease-encourage patient be compliant with methimazole  Plan: Says she will be bringing FMLA papers by for me to complete.  Ativan prescribed for sleep.  Continue Zoloft 50 mg daily.

## 2019-02-07 NOTE — Patient Instructions (Addendum)
Continue Zoloft 50 mg daily.  Ativan 1 mg tablet 1/2-1 p.o. before bedtime.  Encourage patient continue with methimazole

## 2019-02-07 NOTE — Patient Instructions (Addendum)
Continue to see endocrinologist and take methimazole.  FMLA forms to be completed here in the near future.  Ativan given for insomnia/anxiety

## 2019-02-07 NOTE — Progress Notes (Addendum)
   Subjective:    Patient ID: Jordan Carrillo, female    DOB: 25-Nov-1960, 58 y.o.   MRN: [id number]  HPI 58 year old Female diagnosed with Graves' disease followed currently by endocrinologist.  She has been reluctant to consider radioactive iodine treatment.  Says she is now taking methimazole since February.  Says she will need FMLA form completed in the near future.  She believes in holistic medicine and has been seen at [facility] in [place] and has been reluctant to take medication for Graves' disease.  Her daughter has been living in [place] but apparently has lost her job and will be returning to [place] but wants to live in [place].  She has been sending her daughter money.  Patient has longstanding history of anxiety and depression.  She has Crohn's disease and fibromyalgia syndrome as well as migraine headaches.  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  Says she does not want to apply for permanent disability at this time since her daughter needs financial support.  Patient talks frequently of selling her home and moving elsewhere but has not done this.  Review of Systems see above continues with issues with insomnia anxiety and depression.  Has seen ophthalmologist regarding exophthalmos.     Objective:   Physical Exam She has exophthalmos, vital signs reviewed and are stable today.  Chest clear.  Cardiac exam regular rate and rhythm.       Assessment & Plan:  Situational stress with job and daughter  Luiz Blare' disease seen by endocrinologist.  Reinforced that she should take methimazole and asked her to consider radioactive iodine treatment  Crohn's disease has been prescribed mesalamine  Migraine headaches  Fibromyalgia syndrome  Anxiety and depression-continue Zoloft  Impaired glucose tolerance with hemoglobin A1c recently 6.1%  Pure  hypercholesterolemia-recent labs show total cholesterol 215 with an LDL of 150  Plan: TSH checked March 20 with result less than 1.01.  Free T4 is 1.43.  It is difficult to treat patient as she is resistant to traditional therapies.

## 2019-02-18 ENCOUNTER — Ambulatory Visit: Payer: Self-pay | Admitting: Internal Medicine

## 2019-03-09 ENCOUNTER — Telehealth: Payer: Self-pay

## 2019-03-09 NOTE — Telephone Encounter (Signed)
Scheduled Virtual Visit °

## 2019-03-09 NOTE — Telephone Encounter (Signed)
Needs to take her temp and schedule visit in am

## 2019-03-09 NOTE — Telephone Encounter (Signed)
Patient called she starting having a "throbbing headache" on Saturday and yesterday she was feeling nauseous and she made herself vomit, she said she had some grapes and white bread that morning. She said she has no fever, but she is still working. She still having a throbbing HA and she has body aches she is okay with doing a virtual visit.

## 2019-03-10 ENCOUNTER — Ambulatory Visit: Payer: Self-pay | Admitting: Internal Medicine

## 2019-03-10 ENCOUNTER — Other Ambulatory Visit: Payer: Self-pay

## 2019-03-10 ENCOUNTER — Ambulatory Visit (INDEPENDENT_AMBULATORY_CARE_PROVIDER_SITE_OTHER): Payer: Federal, State, Local not specified - PPO | Admitting: Internal Medicine

## 2019-03-10 ENCOUNTER — Encounter: Payer: Self-pay | Admitting: Internal Medicine

## 2019-03-10 DIAGNOSIS — G43011 Migraine without aura, intractable, with status migrainosus: Secondary | ICD-10-CM

## 2019-03-10 MED ORDER — ONDANSETRON HCL 4 MG PO TABS: 4.0000 mg | ORAL_TABLET | Freq: Three times a day (TID) | ORAL | 0 refills | Status: DC | PRN

## 2019-03-10 MED ORDER — PREDNISONE 10 MG PO TABS: ORAL_TABLET | ORAL | 0 refills | Status: DC

## 2019-03-10 NOTE — Progress Notes (Signed)
   Subjective:    Patient ID: Jordan Carrillo, female    DOB: 1961-04-11, 58 y.o.   MRN: [id number]  HPI 58 year old Female seen today by [employer] due to the Coronavirus pandemic.  She has been out of work with protracted headache symptoms.  Cannot seem to get any relief.  Has had headache, photophobia, nausea.  She has remote history of migraine headaches that have responded to prednisone in the remote past.  Patient gives consent to visit in this format.  She is identified by 2 identifiers as Jordan Carrillo, a patient in this practice.  She has a history of Graves' disease and is seeing an endocrinologist.  Says she has been compliant with her hyperthyroid medication.  Symptoms started on April 25.  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  She thinks the chemicals triggered the headache.  She also has been under some significant work and situational stress.  Her daughter remains in [place].    Review of Systems reports no focal deficits such as extremity weakness or facial weakness.     Objective:   Physical Exam Reports no fever.  She is alert and oriented and able to give a clear concise history.  No obvious facial deformity or eye drooping.  Appears to have some mild exophthalmos from hyperthyroidism.  Moves all 4 extremities.       Assessment & Plan:  Protracted migraine headache  Plan: She is comfortable remaining out of work a couple of more days.  Have called in for her prednisone 10 mg (#21) going from 60 mg to 0 mg over 7 days which hopefully will break the headache cycle for her.  She will take Zofran 4 mg every 8 hours as needed nausea.  She will rest at home 2-3 more days.  Is scheduled to return to work this coming Saturday apparently.  She has FMLA paperwork and affect.

## 2019-03-10 NOTE — Patient Instructions (Signed)
Zofran 4 mg tablets 1 p.o. every 8 hours as needed nausea.  Prednisone 10 mg tablets #21 going from 60 mg to 0 mg over 7 days and tapering course.  Out of work until headache free.

## 2019-03-12 ENCOUNTER — Telehealth: Payer: Self-pay

## 2019-03-12 NOTE — Telephone Encounter (Signed)
Patient called states she would like to be tested for the COVID19 she has symptoms such as trouble breathing, temperature fluctuating (does not not what it is bc she does not have a thermometer at home), and weird taste. Dr. Lenord Fellers advised patient to go to the ER due to persistent symptoms.

## 2019-03-24 ENCOUNTER — Ambulatory Visit (INDEPENDENT_AMBULATORY_CARE_PROVIDER_SITE_OTHER): Payer: Federal, State, Local not specified - PPO | Admitting: Internal Medicine

## 2019-03-24 ENCOUNTER — Encounter: Payer: Self-pay | Admitting: Internal Medicine

## 2019-03-24 ENCOUNTER — Other Ambulatory Visit: Payer: Self-pay

## 2019-03-24 VITALS — BP 110/58 | HR 80 | Temp 98.2°F | Wt 118.8 lb

## 2019-03-24 DIAGNOSIS — E0500 Thyrotoxicosis with diffuse goiter without thyrotoxic crisis or storm: Secondary | ICD-10-CM | POA: Diagnosis not present

## 2019-03-24 DIAGNOSIS — E0590 Thyrotoxicosis, unspecified without thyrotoxic crisis or storm: Secondary | ICD-10-CM | POA: Diagnosis not present

## 2019-03-24 DIAGNOSIS — R748 Abnormal levels of other serum enzymes: Secondary | ICD-10-CM

## 2019-03-24 LAB — COMPREHENSIVE METABOLIC PANEL
ALT: 16 U/L (ref 0–35)
AST: 19 U/L (ref 0–37)
Albumin: 4.2 g/dL (ref 3.5–5.2)
Alkaline Phosphatase: 125 U/L — ABNORMAL HIGH (ref 39–117)
BUN: 15 mg/dL (ref 6–23)
CO2: 28 mEq/L (ref 19–32)
Calcium: 9.4 mg/dL (ref 8.4–10.5)
Chloride: 101 mEq/L (ref 96–112)
Creatinine, Ser: 0.66 mg/dL (ref 0.40–1.20)
GFR: 92.8 mL/min (ref >60.00)
Glucose, Bld: 86 mg/dL (ref 70–99)
Potassium: 4 mEq/L (ref 3.5–5.1)
Sodium: 137 mEq/L (ref 135–145)
Total Bilirubin: 0.6 mg/dL (ref 0.2–1.2)
Total Protein: 7.4 g/dL (ref 6.0–8.3)

## 2019-03-24 LAB — TSH: TSH: 0.02 u[IU]/mL — ABNORMAL LOW (ref 0.35–4.50)

## 2019-03-24 LAB — T4, FREE: Free T4: 0.86 ng/dL (ref 0.60–1.60)

## 2019-03-24 NOTE — Progress Notes (Signed)
Name: Jordan Carrillo  MRN/ DOB: [id number], July 16, 1961    Age/ Sex: 58 y.o., female     PCP: Margaree Mackintosh, MD   Reason for Endocrinology Evaluation: Hyperthyroidism      Initial Endocrinology Clinic Visit: 10/31/18    PATIENT IDENTIFIER: Jordan Carrillo is a 58 y.o., female with a past medical history of Chron's disease . She has followed with [facility] since 10/31/18 for consultative assistance with management of her low TSH .   HISTORICAL SUMMARY: The patient was first diagnosed with graves' disease through [facility] in September 2019, she was also diagnosed with Crohn's disease in August 2019.  She believes in holistic medicine and her graves' disease treatment started prior to seeing me  through [facility] by putting her on Lithium and Naltrexone protocol .  Her initial TFTs were more consistent with subclinical hyperthyroidism but this is because she was already on lithium.  She eventually stopped all supplements and agreed to start on methimazole with a lot of reluctance on December 18, 2018  SUBJECTIVE:   During last visit (01/08/2019): We continued Methimazole 20 mg daily  She was still hyperthyroid.   Today (03/24/2019):  Ms. Jordan Carrillo is here for 3 month follow-up on her Graves' disease.  Patient states she has been taking methimazole 20 mg daily and compliant with it.   She denies any local neck symptoms, she continues with eye irritation, a diplopia, she oculo plasty was postponed at this time due to COVID- 19 restrictions.  She did lose weight that she attributes to crohn's disease Her palpitations, anxiety and jittery sensation have been stable.   She had a bout of headaches and received prednisone ~ 4/27th  She had an episode of vomiting on Sunday.  She work for the [employer]   ROS:  As per HPI.   HISTORY:  Past Medical History:  Past Medical History:  Diagnosis Date  . Bilateral carpal tunnel  syndrome   . Cervical radiculopathy   . Hyperlipidemia   . Lumbar radiculopathy   . Migraine   . Ulcer    Past Surgical History:  Past Surgical History:  Procedure Laterality Date  . NO PAST SURGERIES      Social History:  reports that she has never smoked. She has never used smokeless tobacco. She reports that she does not drink alcohol or use drugs.  Family History: family history includes Bone cancer in her father; Diabetes in her brother; Kidney cancer in her father; Uterine cancer in her mother.   HOME MEDICATIONS: Allergies as of 03/24/2019      Reactions   Prednisone Anxiety   Latex Other (See Comments)      Medication List       Accurate as of Mar 24, 2019  3:04 PM. If you have any questions, ask your nurse or doctor.        hydrocortisone 100 MG/60ML enema Commonly known as:  CORTENEMA Place 1 enema rectally at bedtime.   LORazepam 1 MG tablet Commonly known as:  ATIVAN Take 1/2 to 1 tab by mouth daily   mesalamine 4 g enema Commonly known as:  ROWASA Place 4 g rectally as needed.   Apriso 0.375 g 24 hr capsule Generic drug:  mesalamine Take 4 tablets by mouth daily.   methimazole 10 MG tablet Commonly known as:  TAPAZOLE Take 2 tablets (20 mg total) by mouth daily.   ondansetron 4 MG tablet Commonly known as:  Zofran Take 1  tablet (4 mg total) by mouth every 8 (eight) hours as needed for nausea or vomiting.   predniSONE 10 MG tablet Commonly known as:  DELTASONE TAKE IN TAPERING COURSE 6,5,4,3,2,1   sertraline 50 MG tablet Commonly known as:  ZOLOFT Take 1 tablet (50 mg total) by mouth daily.         OBJECTIVE:   PHYSICAL EXAM: VS: BP (!) 110/58 (BP Location: Left Arm, Patient Position: Sitting, Cuff Size: Normal)   Pulse 80   Temp 98.2 F (36.8 C) (Oral)   Wt 118 lb 12.8 oz (53.9 kg)   LMP 01/18/2016 Comment: irregular periods  SpO2 99%   BMI 20.39 kg/m    EXAM: General: Pt appears well and is in NAD  Eyes: External eye  exam shows proptosis R<L, chemosis and lid lag, patient has right medial deviation of the eye.   Neck: General: Supple without adenopathy. Thyroid: Thyroid size normal.  No goiter or nodules appreciated. No thyroid bruit.  Lungs: Clear with good BS bilat with no rales, rhonchi, or wheezes  Heart: Auscultation: RRR.  Abdomen: Normoactive bowel sounds, soft, nontender, without masses or organomegaly palpable  Extremities:  BL LE: No pretibial edema normal ROM and strength.  Skin: Hair: Texture and amount normal with gender appropriate distribution Skin Inspection: No rashes. Skin Palpation: Skin temperature, texture, and thickness normal to palpation  Neuro: Cranial nerves: II - XII grossly intact  DTRs: 2+ and symmetric in UE without delay in relaxation phase  Mental Status: Judgment, insight: Intact Orientation: Oriented to time, place, and person Mood and affect: anxious     DATA REVIEWED:  Results for Cy BlamerDEODATH, Saida (MRN [id number]) as of 03/24/2019 15:05  Ref. Range 10/31/2018 16:20  Thyrotropin Receptor Ab Latest Ref Range: <=16.0 % 50.6 (H)    ASSESSMENT / PLAN / RECOMMENDATIONS:   1. Hyperthyroidism Secondary to Graves' Disease:    - Clinically she is euthyroid  - No local neck symptoms.  - She has been tolerating methimazole without side effects.  - Encouraged compliance with medicine  - Clinically she looks better to me then she has done in the past.    Medications  Methimazole 20 mg daily   2) Graves' Disease:   - Patient is following with [facility] for graves' orbitopathy.   3) Elevated Alkaline Phosphatase:   - This is minimally elevated  - D/D include crohn's disease, vs hyperthyroidism vs methimazole  - Will repeat on next lab test with [facility].     F/u 2 months  Labs in 4 weeks    Signed electronically by: Lyndle HerrlichAbby Jaralla Abrianna Sidman, MD  [facility]  [facility] [address]  [place], [place] [address] Phone: [phone] FAX: [phone]      CC: Margaree MackintoshBaxley, Mary J, MD [address] [place] [address] Phone: [phone]  Fax: [phone]   Return to Endocrinology clinic as below: Future Appointments  Date Time Provider Department Center  04/21/2019  2:15 PM [facility] None  05/19/2019  2:20 PM Asim Gersten, Konrad DoloresIbtehal Jaralla, MD [facility] None

## 2019-03-24 NOTE — Patient Instructions (Signed)
We recommend that you follow these hyperthyroidism instructions at home:  1) Take Methimazole 20 mg once a day     

## 2019-03-25 ENCOUNTER — Encounter: Payer: Self-pay | Admitting: Internal Medicine

## 2019-03-25 DIAGNOSIS — R748 Abnormal levels of other serum enzymes: Secondary | ICD-10-CM | POA: Insufficient documentation

## 2019-04-10 DIAGNOSIS — Z20828 Contact with and (suspected) exposure to other viral communicable diseases: Secondary | ICD-10-CM | POA: Diagnosis not present

## 2019-04-16 ENCOUNTER — Telehealth: Payer: Self-pay | Admitting: Internal Medicine

## 2019-04-16 NOTE — Telephone Encounter (Signed)
Patient called re: patient has an appointment with [facility] 05/04/19. Patient was advised to get her pituitary glands checked. Patient has been having double vision which is getting worse. Patient is having a hard time seeing. Eye Dr. told patient he would not change her RX until the test from [facility] comes back. Please call patient at ph#   [phone] asap-per patient's request.

## 2019-04-16 NOTE — Telephone Encounter (Signed)
Pt called and wants to know if Dr Lenord Fellers can order a scan of her pituitary gland in reference to her thyroid

## 2019-04-16 NOTE — Telephone Encounter (Signed)
The endocrinologist has to help her with this

## 2019-04-16 NOTE — Telephone Encounter (Signed)
Patient was notified.

## 2019-04-17 NOTE — Telephone Encounter (Signed)
lft vm to return call 

## 2019-04-17 NOTE — Telephone Encounter (Signed)
Spoke to pt and she wanted you to order a pituitary gland scan for her. Told her that you may need her to come in, please advise

## 2019-04-17 NOTE — Telephone Encounter (Signed)
Spoke to pt and relayed this information, pt was very upset and stated that she could not receive the care she felt she needed from Dr. Lonzo Cloud.

## 2019-04-21 ENCOUNTER — Other Ambulatory Visit (INDEPENDENT_AMBULATORY_CARE_PROVIDER_SITE_OTHER): Payer: Federal, State, Local not specified - PPO

## 2019-04-21 ENCOUNTER — Other Ambulatory Visit: Payer: Self-pay

## 2019-04-21 DIAGNOSIS — E0500 Thyrotoxicosis with diffuse goiter without thyrotoxic crisis or storm: Secondary | ICD-10-CM

## 2019-04-21 DIAGNOSIS — R748 Abnormal levels of other serum enzymes: Secondary | ICD-10-CM | POA: Diagnosis not present

## 2019-04-21 LAB — TSH: TSH: 0.79 u[IU]/mL (ref 0.35–4.50)

## 2019-04-21 LAB — HEPATIC FUNCTION PANEL
ALT: 25 U/L (ref 0–35)
AST: 28 U/L (ref 0–37)
Albumin: 4.1 g/dL (ref 3.5–5.2)
Alkaline Phosphatase: 86 U/L (ref 39–117)
Bilirubin, Direct: 0.1 mg/dL (ref 0.0–0.3)
Total Bilirubin: 0.4 mg/dL (ref 0.2–1.2)
Total Protein: 7.3 g/dL (ref 6.0–8.3)

## 2019-04-21 LAB — T4, FREE: Free T4: 0.74 ng/dL (ref 0.60–1.60)

## 2019-04-21 LAB — GAMMA GT: GGT: 31 U/L (ref 7–51)

## 2019-04-22 ENCOUNTER — Encounter: Payer: Self-pay | Admitting: Internal Medicine

## 2019-04-24 ENCOUNTER — Encounter: Payer: Self-pay | Admitting: Internal Medicine

## 2019-04-24 ENCOUNTER — Ambulatory Visit: Payer: Federal, State, Local not specified - PPO | Admitting: Internal Medicine

## 2019-04-24 ENCOUNTER — Ambulatory Visit (INDEPENDENT_AMBULATORY_CARE_PROVIDER_SITE_OTHER): Payer: Federal, State, Local not specified - PPO | Admitting: Internal Medicine

## 2019-04-24 VITALS — BP 120/80 | HR 80 | Temp 98.2°F | Ht 64.0 in | Wt 126.0 lb

## 2019-04-24 DIAGNOSIS — R410 Disorientation, unspecified: Secondary | ICD-10-CM

## 2019-04-24 DIAGNOSIS — F329 Major depressive disorder, single episode, unspecified: Secondary | ICD-10-CM

## 2019-04-24 DIAGNOSIS — F419 Anxiety disorder, unspecified: Secondary | ICD-10-CM | POA: Diagnosis not present

## 2019-04-24 DIAGNOSIS — F439 Reaction to severe stress, unspecified: Secondary | ICD-10-CM | POA: Diagnosis not present

## 2019-04-24 DIAGNOSIS — R42 Dizziness and giddiness: Secondary | ICD-10-CM

## 2019-04-24 DIAGNOSIS — F32A Depression, unspecified: Secondary | ICD-10-CM

## 2019-04-24 MED ORDER — SERTRALINE HCL 100 MG PO TABS: 100.0000 mg | ORAL_TABLET | Freq: Every day | ORAL | 3 refills | Status: DC

## 2019-04-24 MED ORDER — LORAZEPAM 1 MG PO TABS: 1.0000 mg | ORAL_TABLET | Freq: Two times a day (BID) | ORAL | 1 refills | Status: DC | PRN

## 2019-04-25 LAB — SAR COV2 SEROLOGY (COVID19)AB(IGG),IA: SARS CoV2 AB IGG: NEGATIVE

## 2019-04-28 DIAGNOSIS — E0500 Thyrotoxicosis with diffuse goiter without thyrotoxic crisis or storm: Secondary | ICD-10-CM | POA: Diagnosis not present

## 2019-04-28 DIAGNOSIS — R7303 Prediabetes: Secondary | ICD-10-CM | POA: Diagnosis not present

## 2019-04-28 DIAGNOSIS — K5010 Crohn's disease of large intestine without complications: Secondary | ICD-10-CM | POA: Diagnosis not present

## 2019-04-28 DIAGNOSIS — R5383 Other fatigue: Secondary | ICD-10-CM | POA: Diagnosis not present

## 2019-05-04 DIAGNOSIS — G4700 Insomnia, unspecified: Secondary | ICD-10-CM | POA: Diagnosis not present

## 2019-05-04 DIAGNOSIS — K5010 Crohn's disease of large intestine without complications: Secondary | ICD-10-CM | POA: Diagnosis not present

## 2019-05-04 DIAGNOSIS — E0500 Thyrotoxicosis with diffuse goiter without thyrotoxic crisis or storm: Secondary | ICD-10-CM | POA: Diagnosis not present

## 2019-05-04 DIAGNOSIS — R198 Other specified symptoms and signs involving the digestive system and abdomen: Secondary | ICD-10-CM | POA: Diagnosis not present

## 2019-05-04 DIAGNOSIS — H05821 Myopathy of extraocular muscles, right orbit: Secondary | ICD-10-CM | POA: Diagnosis not present

## 2019-05-04 DIAGNOSIS — E079 Disorder of thyroid, unspecified: Secondary | ICD-10-CM | POA: Diagnosis not present

## 2019-05-04 DIAGNOSIS — R7303 Prediabetes: Secondary | ICD-10-CM | POA: Diagnosis not present

## 2019-05-07 DIAGNOSIS — K5010 Crohn's disease of large intestine without complications: Secondary | ICD-10-CM | POA: Diagnosis not present
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
## 2019-05-10 NOTE — Patient Instructions (Signed)
COVID-19 antibody  test is negative

## 2019-05-14 ENCOUNTER — Other Ambulatory Visit: Payer: Self-pay

## 2019-05-19 ENCOUNTER — Ambulatory Visit: Payer: Federal, State, Local not specified - PPO | Admitting: Internal Medicine

## 2019-05-28 DIAGNOSIS — E0500 Thyrotoxicosis with diffuse goiter without thyrotoxic crisis or storm: Secondary | ICD-10-CM | POA: Diagnosis not present

## 2019-05-28 DIAGNOSIS — K5010 Crohn's disease of large intestine without complications: Secondary | ICD-10-CM | POA: Diagnosis not present

## 2019-05-28 DIAGNOSIS — R198 Other specified symptoms and signs involving the digestive system and abdomen: Secondary | ICD-10-CM | POA: Diagnosis not present

## 2019-05-28 DIAGNOSIS — R7303 Prediabetes: Secondary | ICD-10-CM | POA: Diagnosis not present

## 2019-06-11 ENCOUNTER — Other Ambulatory Visit: Payer: Self-pay

## 2019-06-12 ENCOUNTER — Ambulatory Visit: Payer: Federal, State, Local not specified - PPO | Admitting: Internal Medicine

## 2019-06-12 ENCOUNTER — Ambulatory Visit (INDEPENDENT_AMBULATORY_CARE_PROVIDER_SITE_OTHER): Payer: Federal, State, Local not specified - PPO | Admitting: Internal Medicine

## 2019-06-12 ENCOUNTER — Encounter: Payer: Self-pay | Admitting: Internal Medicine

## 2019-06-12 ENCOUNTER — Other Ambulatory Visit: Payer: Self-pay

## 2019-06-12 VITALS — BP 110/60 | HR 82 | Temp 98.4°F | Ht 64.0 in | Wt 120.0 lb

## 2019-06-12 VITALS — BP 114/68 | HR 80 | Temp 98.2°F | Ht 64.0 in | Wt 121.2 lb

## 2019-06-12 DIAGNOSIS — F419 Anxiety disorder, unspecified: Secondary | ICD-10-CM | POA: Diagnosis not present

## 2019-06-12 DIAGNOSIS — F339 Major depressive disorder, recurrent, unspecified: Secondary | ICD-10-CM

## 2019-06-12 DIAGNOSIS — E0500 Thyrotoxicosis with diffuse goiter without thyrotoxic crisis or storm: Secondary | ICD-10-CM

## 2019-06-12 DIAGNOSIS — K529 Noninfective gastroenteritis and colitis, unspecified: Secondary | ICD-10-CM

## 2019-06-12 DIAGNOSIS — F439 Reaction to severe stress, unspecified: Secondary | ICD-10-CM

## 2019-06-12 DIAGNOSIS — F329 Major depressive disorder, single episode, unspecified: Secondary | ICD-10-CM

## 2019-06-12 DIAGNOSIS — E0590 Thyrotoxicosis, unspecified without thyrotoxic crisis or storm: Secondary | ICD-10-CM

## 2019-06-12 DIAGNOSIS — L299 Pruritus, unspecified: Secondary | ICD-10-CM

## 2019-06-12 DIAGNOSIS — F32A Depression, unspecified: Secondary | ICD-10-CM

## 2019-06-12 MED ORDER — METHYLPREDNISOLONE ACETATE 80 MG/ML IJ SUSP: 80.0000 mg | Freq: Once | INTRAMUSCULAR | Status: DC

## 2019-06-12 MED ORDER — ALPRAZOLAM 0.25 MG PO TABS: 0.2500 mg | ORAL_TABLET | Freq: Two times a day (BID) | ORAL | 1 refills | Status: DC

## 2019-06-12 NOTE — Patient Instructions (Signed)
We recommend that you follow these hyperthyroidism instructions at home:  1) Take Methimazole 20 mg once a day

## 2019-06-12 NOTE — Progress Notes (Signed)
Name: Jordan Carrillo  MRN/ DOB: [id number], 07/29/1961    Age/ Sex: 58 y.o., female     PCP: Elby Showers, MD   Reason for Endocrinology Evaluation: Hyperthyroidism      Initial Endocrinology Clinic Visit: 10/31/18    PATIENT IDENTIFIER: Jordan Carrillo is a 58 y.o., female with a past medical history of Chron's disease . She has followed with [facility] since 10/31/18 for consultative assistance with management of her low TSH .   HISTORICAL SUMMARY: The patient was first diagnosed with graves' disease through [facility] in September 2019, she was also diagnosed with Crohn's disease in August 2019.  She believes in holistic medicine and her graves' disease treatment started prior to seeing me  through [facility] by putting her on Lithium and Naltrexone protocol .  Her initial TFTs were more consistent with subclinical hyperthyroidism but this is because she was already on lithium.  She eventually stopped all supplements and agreed to start on methimazole with a lot of reluctance on December 18, 2018  SUBJECTIVE:   During last visit (03/24/2019): We continued Methimazole 20 mg daily.  Today (06/12/2019):  Jordan Carrillo is here for 3 month follow-up on her Graves' disease.  Patient states she has been taking methimazole 20 mg daily and denies any side effects.   She denies any local neck symptoms. She continues with eye irritation, and diplopia, she will not go through oculoplasty , she will be started on Teprozumumab, she feels worse when she is at work and is getting ready to retire medically from work by August.  She did lose weight that she attributes to crohn's disease Her palpitations, anxiety and jittery sensation have been stable, but 2 weeks ago started pruritus all over without an apparent rash.    She work for the [employer]:  As per HPI.   HISTORY:  Past Medical History:  Past Medical History:   Diagnosis Date  . Bilateral carpal tunnel syndrome   . Cervical radiculopathy   . Hyperlipidemia   . Lumbar radiculopathy   . Migraine   . Ulcer    Past Surgical History:  Past Surgical History:  Procedure Laterality Date  . NO PAST SURGERIES      Social History:  reports that she has never smoked. She has never used smokeless tobacco. She reports that she does not drink alcohol or use drugs.  Family History: family history includes Bone cancer in her father; Diabetes in her brother; Kidney cancer in her father; Uterine cancer in her mother.   HOME MEDICATIONS: Allergies as of 06/12/2019      Reactions   Prednisone Anxiety   Latex Other (See Comments)      Medication List       Accurate as of June 12, 2019 12:48 PM. If you have any questions, ask your nurse or doctor.        STOP taking these medications   hydrocortisone 100 MG/60ML enema Commonly known as: CORTENEMA Stopped by: Elby Showers, MD     TAKE these medications   LORazepam 1 MG tablet Commonly known as: ATIVAN Take 1 tablet (1 mg total) by mouth 2 (two) times daily as needed for anxiety.   mesalamine 4 g enema Commonly known as: ROWASA Place 4 g rectally as needed.   Apriso 0.375 g 24 hr capsule Generic drug: mesalamine Take 4 tablets by mouth daily.   methimazole 10 MG tablet Commonly known as: TAPAZOLE Take  2 tablets (20 mg total) by mouth daily.   sertraline 100 MG tablet Commonly known as: ZOLOFT Take 1 tablet (100 mg total) by mouth daily.         OBJECTIVE:   PHYSICAL EXAM: VS: LMP 01/18/2016 Comment: irregular periods   EXAM: General: Pt appears well and is in NAD  Neck: General: Supple without adenopathy. Thyroid: Thyroid size normal.  No goiter or nodules appreciated. No thyroid bruit.  Lungs: Clear with good BS bilat with no rales, rhonchi, or wheezes  Heart: Auscultation: RRR.  Abdomen: Normoactive bowel sounds, soft, nontender, without masses or organomegaly palpable   Extremities:  BL LE: No pretibial edema normal ROM and strength.  Skin: Hair: Texture and amount normal with gender appropriate distribution Skin Inspection: No rashes except excoriation marks Skin Palpation: Skin temperature, texture, and thickness normal to palpation  Mental Status: Judgment, insight: Intact Orientation: Oriented to time, place, and person Mood and affect: anxious     DATA REVIEWED: Results for Jordan Carrillo, Jordan Carrillo (MRN [id number]) as of 06/15/2019 10:58  Ref. Range 04/21/2019 13:44 06/12/2019 12:46  TSH Latest Units: mIU/L 0.79 0.03 (L)  T4,Free(Direct) Latest Ref Range: 0.8 - 1.8 ng/dL 5.18 1.8   Results for Jordan Carrillo, Jordan Carrillo (MRN [id number]) as of 03/24/2019 15:05  Ref. Range 10/31/2018 16:20  Thyrotropin Receptor Ab Latest Ref Range: <=16.0 % 50.6 (H)    ASSESSMENT / PLAN / RECOMMENDATIONS:   1. Hyperthyroidism Secondary to Graves' Disease:    - Clinically she is euthyroid  - No local neck symptoms.  - She has been tolerating methimazole without side effects.  - Her T4 has increased dramatically since her lab results in 04/2019 despite continuing the same dose of Methimazole 20 mg daily , this is consistent with medication non-compliance  - No changes will be made today , encouraged compliance (a portal message was sent)   Medications  Continue Methimazole 20 mg daily   2) Graves' Disease:  - Patient is following with Dr. Sherryll Burger at [facility] for graves' orbitopathy.      F/u 3 months  Labs in 6 weeks    Signed electronically by: Lyndle Herrlich, MD  [facility]  [facility] [address] [place], [place] [address] Phone: [phone] FAX: [phone]      CC: Margaree Mackintosh, MD [address] [place] [address] Phone: [phone]  Fax: [phone]   Return to Endocrinology clinic as below: Future Appointments  Date Time Provider Department Center  06/12/2019  3:20  PM Twilla Khouri, Konrad Dolores, MD [facility] None

## 2019-06-12 NOTE — Progress Notes (Signed)
   Subjective:    Patient ID: Jordan Carrillo, female    DOB: 09-12-1961, 58 y.o.   MRN: [id number]  HPI 58 year old Female with history of Graves' disease currently on Methimazole in today with complaint of intense pruritus.  Itching all over.  Has some scarring from scratching apparently.  Has appointment with endocrinologist Dr. Kelton Pillar for later today.  We have drawn free T4, TSH CBC and C met.  I wanted to look at liver functions with complaint of pruritus as well as eosinophils on differential.  Explained to her that her recent antibody for COVID-19 was negative in June.  Issues persist with her daughter who is currently residing in [place].  She gave furniture to her daughter recently.  Patient says she is going to retire at the end of August.  She is frustrated with the [employer].  She was seen at [facility] in July but says she is not going back there.  She has a history of Crohn's disease.  Has been using some oil on her skin that she feels is not making the itching worse.  Have advised Dove soap and cool showers.  May want to use a mild detergent such as Woolite or Dreft.  My feeling is that anxiety is provoking this itching based on her conversation about her daughter.  Review of Systems Has been eating a lot of dandelion greens but  recentlyhas stopped. Has stopped avocado and bannana     Objective:   Physical Exam  I do not see a distinct rash.  She points to several scars on her arms that she says were caused by scratching.  These appear to be old.  She has exophthalmos.  Her eyes are injected bilaterally.  Her affect is slightly anxious.      Assessment & Plan:  Pruritus-etiology unclear.  Considerations include liver disease, topical preparations that are irritating, thyroid disease, allergic dermatitis.  Anxiety-prescribed Xanax 0.25 mg twice daily in addition to Zoloft.  She  does not want to pursue counseling.  Situational stress with job and daughter.  Says she is retiring at the end of August.  Labs pending including CBC C met free T4 and TSH.  I was going to give her an injection of Depo-Medrol but she declines.  May want to use Sarna lotion for itching.  Could consider Eucerin cream as well.  Her skin seems to be slightly dry.  25 minutes spent with patient.  Labs are pending.  Says she is not eating any foods that would cause itching that she is aware of.  Does not want to go to counseling.

## 2019-06-12 NOTE — Patient Instructions (Signed)
Labs drawn and pending including free T4 and TSH CBC and C met.  Take Xanax twice daily for anxiety and itching.  May try Sarna or Eucerin on your scan.  Follow-up with endocrinologist later today.  Continue Zoloft.

## 2019-06-13 LAB — CBC WITH DIFFERENTIAL/PLATELET
Absolute Monocytes: 579 cells/uL (ref 200–950)
Basophils Absolute: 62 cells/uL (ref 0–200)
Basophils Relative: 0.7 %
Eosinophils Absolute: 614 cells/uL — ABNORMAL HIGH (ref 15–500)
Eosinophils Relative: 6.9 %
HCT: 37 % (ref 35.0–45.0)
Hemoglobin: 12.3 g/dL (ref 11.7–15.5)
Lymphs Abs: 3071 cells/uL (ref 850–3900)
MCH: 32.2 pg (ref 27.0–33.0)
MCHC: 33.2 g/dL (ref 32.0–36.0)
MCV: 96.9 fL (ref 80.0–100.0)
MPV: 10.2 fL (ref 7.5–12.5)
Monocytes Relative: 6.5 %
Neutro Abs: 4575 cells/uL (ref 1500–7800)
Neutrophils Relative %: 51.4 %
Platelets: 320 10*3/uL (ref 140–400)
RBC: 3.82 10*6/uL (ref 3.80–5.10)
RDW: 12.6 % (ref 11.0–15.0)
Total Lymphocyte: 34.5 %
WBC: 8.9 10*3/uL (ref 3.8–10.8)

## 2019-06-13 LAB — COMPLETE METABOLIC PANEL WITH GFR
AG Ratio: 1.4 (calc) (ref 1.0–2.5)
ALT: 17 U/L (ref 6–29)
AST: 20 U/L (ref 10–35)
Albumin: 4.1 g/dL (ref 3.6–5.1)
Alkaline phosphatase (APISO): 71 U/L (ref 37–153)
BUN: 18 mg/dL (ref 7–25)
CO2: 26 mmol/L (ref 20–32)
Calcium: 9.3 mg/dL (ref 8.6–10.4)
Chloride: 103 mmol/L (ref 98–110)
Creat: 0.64 mg/dL (ref 0.50–1.05)
GFR, Est African American: 116 mL/min/{1.73_m2} (ref >=60)
GFR, Est Non African American: 100 mL/min/{1.73_m2} (ref >=60)
Globulin: 2.9 g/dL (calc) (ref 1.9–3.7)
Glucose, Bld: 89 mg/dL (ref 65–99)
Potassium: 4.2 mmol/L (ref 3.5–5.3)
Sodium: 139 mmol/L (ref 135–146)
Total Bilirubin: 0.5 mg/dL (ref 0.2–1.2)
Total Protein: 7 g/dL (ref 6.1–8.1)

## 2019-06-13 LAB — TSH: TSH: 0.03 mIU/L — ABNORMAL LOW

## 2019-06-13 LAB — T4, FREE: Free T4: 1.8 ng/dL (ref 0.8–1.8)

## 2019-06-15 ENCOUNTER — Encounter: Payer: Self-pay | Admitting: Internal Medicine

## 2019-06-15 NOTE — Progress Notes (Signed)
Thanks- thought so too

## 2019-06-16 ENCOUNTER — Other Ambulatory Visit: Payer: Self-pay | Admitting: Internal Medicine

## 2019-06-16 DIAGNOSIS — E0590 Thyrotoxicosis, unspecified without thyrotoxic crisis or storm: Secondary | ICD-10-CM

## 2019-06-28 ENCOUNTER — Other Ambulatory Visit: Payer: Self-pay | Admitting: Internal Medicine

## 2019-07-14 DIAGNOSIS — R5383 Other fatigue: Secondary | ICD-10-CM | POA: Diagnosis not present

## 2019-07-14 DIAGNOSIS — R198 Other specified symptoms and signs involving the digestive system and abdomen: Secondary | ICD-10-CM | POA: Diagnosis not present

## 2019-07-14 DIAGNOSIS — G4700 Insomnia, unspecified: Secondary | ICD-10-CM | POA: Diagnosis not present

## 2019-07-14 DIAGNOSIS — R7303 Prediabetes: Secondary | ICD-10-CM | POA: Diagnosis not present

## 2019-07-14 DIAGNOSIS — K5010 Crohn's disease of large intestine without complications: Secondary | ICD-10-CM | POA: Diagnosis not present

## 2019-07-14 DIAGNOSIS — E0500 Thyrotoxicosis with diffuse goiter without thyrotoxic crisis or storm: Secondary | ICD-10-CM | POA: Diagnosis not present

## 2019-07-15 DIAGNOSIS — R7303 Prediabetes: Secondary | ICD-10-CM | POA: Diagnosis not present

## 2019-07-15 DIAGNOSIS — K5010 Crohn's disease of large intestine without complications: Secondary | ICD-10-CM | POA: Diagnosis not present

## 2019-07-15 DIAGNOSIS — E0500 Thyrotoxicosis with diffuse goiter without thyrotoxic crisis or storm: Secondary | ICD-10-CM | POA: Diagnosis not present

## 2019-07-15 DIAGNOSIS — G4700 Insomnia, unspecified: Secondary | ICD-10-CM | POA: Diagnosis not present

## 2019-07-15 DIAGNOSIS — R5383 Other fatigue: Secondary | ICD-10-CM | POA: Diagnosis not present

## 2019-07-15 DIAGNOSIS — R198 Other specified symptoms and signs involving the digestive system and abdomen: Secondary | ICD-10-CM | POA: Diagnosis not present

## 2019-07-21 DIAGNOSIS — E0590 Thyrotoxicosis, unspecified without thyrotoxic crisis or storm: Secondary | ICD-10-CM | POA: Diagnosis not present

## 2019-07-21 DIAGNOSIS — H5021 Vertical strabismus, right eye: Secondary | ICD-10-CM | POA: Diagnosis not present

## 2019-07-21 DIAGNOSIS — H5203 Hypermetropia, bilateral: Secondary | ICD-10-CM | POA: Diagnosis not present

## 2019-07-21 DIAGNOSIS — H5005 Alternating esotropia: Secondary | ICD-10-CM | POA: Diagnosis not present

## 2019-07-23 DIAGNOSIS — F322 Major depressive disorder, single episode, severe without psychotic features: Secondary | ICD-10-CM | POA: Diagnosis not present

## 2019-07-23 DIAGNOSIS — Z780 Asymptomatic menopausal state: Secondary | ICD-10-CM | POA: Diagnosis not present

## 2019-07-23 DIAGNOSIS — E0500 Thyrotoxicosis with diffuse goiter without thyrotoxic crisis or storm: Secondary | ICD-10-CM | POA: Diagnosis not present

## 2019-09-10 ENCOUNTER — Telehealth: Payer: Self-pay | Admitting: Internal Medicine

## 2019-09-10 NOTE — Telephone Encounter (Signed)
Called patient to schedule CPE and Labs due after 09/26/19. We are booking out into Feb for CPE's, she did not want to schedule because she stated she would be moving, she is selling her house and going to move closer to family. We did schedule follow up appointment in November.

## 2019-09-25 ENCOUNTER — Encounter: Payer: Self-pay | Admitting: Internal Medicine

## 2019-09-25 ENCOUNTER — Other Ambulatory Visit: Payer: Self-pay

## 2019-09-25 ENCOUNTER — Ambulatory Visit (INDEPENDENT_AMBULATORY_CARE_PROVIDER_SITE_OTHER): Payer: Federal, State, Local not specified - PPO | Admitting: Internal Medicine

## 2019-09-25 VITALS — BP 100/70 | HR 78 | Ht 64.0 in | Wt 122.0 lb

## 2019-09-25 DIAGNOSIS — R7302 Impaired glucose tolerance (oral): Secondary | ICD-10-CM | POA: Diagnosis not present

## 2019-09-25 DIAGNOSIS — F419 Anxiety disorder, unspecified: Secondary | ICD-10-CM | POA: Diagnosis not present

## 2019-09-25 DIAGNOSIS — L659 Nonscarring hair loss, unspecified: Secondary | ICD-10-CM

## 2019-09-25 DIAGNOSIS — E0590 Thyrotoxicosis, unspecified without thyrotoxic crisis or storm: Secondary | ICD-10-CM

## 2019-09-25 DIAGNOSIS — R5383 Other fatigue: Secondary | ICD-10-CM

## 2019-09-25 DIAGNOSIS — F32A Depression, unspecified: Secondary | ICD-10-CM

## 2019-09-25 DIAGNOSIS — F329 Major depressive disorder, single episode, unspecified: Secondary | ICD-10-CM

## 2019-09-25 MED ORDER — METHIMAZOLE 10 MG PO TABS: ORAL_TABLET | ORAL | 0 refills | Status: DC

## 2019-09-25 MED ORDER — SERTRALINE HCL 100 MG PO TABS: 100.0000 mg | ORAL_TABLET | Freq: Every day | ORAL | 1 refills | Status: DC

## 2019-09-25 MED ORDER — ALPRAZOLAM 0.5 MG PO TABS: ORAL_TABLET | ORAL | 2 refills | Status: DC
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
## 2019-09-26 LAB — IRON, TOTAL/TOTAL IRON BINDING CAP
%SAT: 18 % (calc) (ref 16–45)
Iron: 50 ug/dL (ref 45–160)
TIBC: 280 mcg/dL (calc) (ref 250–450)

## 2019-09-26 LAB — CBC WITH DIFFERENTIAL/PLATELET
Absolute Monocytes: 478 cells/uL (ref 200–950)
Basophils Absolute: 37 cells/uL (ref 0–200)
Basophils Relative: 0.4 %
Eosinophils Absolute: 166 cells/uL (ref 15–500)
Eosinophils Relative: 1.8 %
HCT: 35.7 % (ref 35.0–45.0)
Hemoglobin: 11.6 g/dL — ABNORMAL LOW (ref 11.7–15.5)
Lymphs Abs: 3956 cells/uL — ABNORMAL HIGH (ref 850–3900)
MCH: 29.7 pg (ref 27.0–33.0)
MCHC: 32.5 g/dL (ref 32.0–36.0)
MCV: 91.5 fL (ref 80.0–100.0)
MPV: 10 fL (ref 7.5–12.5)
Monocytes Relative: 5.2 %
Neutro Abs: 4563 cells/uL (ref 1500–7800)
Neutrophils Relative %: 49.6 %
Platelets: 335 10*3/uL (ref 140–400)
RBC: 3.9 10*6/uL (ref 3.80–5.10)
RDW: 12.9 % (ref 11.0–15.0)
Total Lymphocyte: 43 %
WBC: 9.2 10*3/uL (ref 3.8–10.8)

## 2019-09-26 LAB — COMPLETE METABOLIC PANEL WITH GFR
AG Ratio: 1.5 (calc) (ref 1.0–2.5)
ALT: 16 U/L (ref 6–29)
AST: 18 U/L (ref 10–35)
Albumin: 4.1 g/dL (ref 3.6–5.1)
Alkaline phosphatase (APISO): 91 U/L (ref 37–153)
BUN: 16 mg/dL (ref 7–25)
CO2: 24 mmol/L (ref 20–32)
Calcium: 9.4 mg/dL (ref 8.6–10.4)
Chloride: 104 mmol/L (ref 98–110)
Creat: 0.78 mg/dL (ref 0.50–1.05)
GFR, Est African American: 98 mL/min/{1.73_m2} (ref >=60)
GFR, Est Non African American: 85 mL/min/{1.73_m2} (ref >=60)
Globulin: 2.7 g/dL (calc) (ref 1.9–3.7)
Glucose, Bld: 107 mg/dL — ABNORMAL HIGH (ref 65–99)
Potassium: 4.3 mmol/L (ref 3.5–5.3)
Sodium: 140 mmol/L (ref 135–146)
Total Bilirubin: 0.2 mg/dL (ref 0.2–1.2)
Total Protein: 6.8 g/dL (ref 6.1–8.1)

## 2019-09-26 LAB — TSH: TSH: 0.37 mIU/L — ABNORMAL LOW (ref 0.40–4.50)

## 2019-09-26 LAB — T4, FREE: Free T4: 0.9 ng/dL (ref 0.8–1.8)

## 2019-09-26 LAB — HEMOGLOBIN A1C
Hgb A1c MFr Bld: 5.8 % of total Hgb — ABNORMAL HIGH (ref <5.7)
Mean Plasma Glucose: 120 (calc)
eAG (mmol/L): 6.6 (calc)

## 2019-09-26 LAB — VITAMIN B12: Vitamin B-12: 823 pg/mL (ref 200–1100)

## 2019-10-01 ENCOUNTER — Ambulatory Visit (INDEPENDENT_AMBULATORY_CARE_PROVIDER_SITE_OTHER): Payer: Federal, State, Local not specified - PPO | Admitting: Internal Medicine

## 2019-10-01 ENCOUNTER — Encounter: Payer: Self-pay | Admitting: Internal Medicine

## 2019-10-01 ENCOUNTER — Other Ambulatory Visit: Payer: Self-pay

## 2019-10-01 ENCOUNTER — Telehealth: Payer: Self-pay | Admitting: Internal Medicine

## 2019-10-01 VITALS — Ht 64.0 in | Wt 122.0 lb

## 2019-10-01 DIAGNOSIS — F439 Reaction to severe stress, unspecified: Secondary | ICD-10-CM | POA: Diagnosis not present

## 2019-10-01 DIAGNOSIS — G43901 Migraine, unspecified, not intractable, with status migrainosus: Secondary | ICD-10-CM

## 2019-10-01 DIAGNOSIS — E0500 Thyrotoxicosis with diffuse goiter without thyrotoxic crisis or storm: Secondary | ICD-10-CM | POA: Diagnosis not present

## 2019-10-01 MED ORDER — HYDROCODONE-ACETAMINOPHEN 5-325 MG PO TABS: ORAL_TABLET | ORAL | 0 refills | Status: DC

## 2019-10-01 MED ORDER — PREDNISONE 10 MG PO TABS: ORAL_TABLET | ORAL | 0 refills | Status: DC

## 2019-10-01 MED ORDER — ONDANSETRON HCL 4 MG PO TABS: 4.0000 mg | ORAL_TABLET | Freq: Three times a day (TID) | ORAL | 0 refills | Status: DC | PRN

## 2019-10-01 NOTE — Telephone Encounter (Signed)
Set up virtual visit. She needs to go back to Endocrinologist about her thyroid. Needs to go ahead and make that appointment.

## 2019-10-01 NOTE — Telephone Encounter (Signed)
She did not pick up phone. Scheduled at 4:15pm today for virtual visit. FYI

## 2019-10-01 NOTE — Telephone Encounter (Signed)
Jordan Carrillo [phone]  [name] called to say that on Saturday she had a throbbing Headache suddenly come on and it has not gone away. She has taken Advil, drank Tea and used cold compresses and nothing seems to be helping.

## 2019-10-01 NOTE — Telephone Encounter (Signed)
Patient called back wants text for virtual visit and she will call and schedule appointment with endocrinologist.

## 2019-10-05 NOTE — Patient Instructions (Signed)
Take prednisone in tapering course as directed.  Rest and drink plenty of fluids.  Norco 5/325 sparingly every 8 hours as needed pain.  Please make appointment to see endocrinologist in the near future
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
## 2019-10-06 ENCOUNTER — Encounter: Payer: Self-pay | Admitting: Internal Medicine

## 2019-10-06 ENCOUNTER — Other Ambulatory Visit: Payer: Self-pay

## 2019-10-06 ENCOUNTER — Ambulatory Visit (INDEPENDENT_AMBULATORY_CARE_PROVIDER_SITE_OTHER): Payer: Federal, State, Local not specified - PPO | Admitting: Internal Medicine

## 2019-10-06 VITALS — Ht 64.0 in | Wt 122.0 lb

## 2019-10-06 DIAGNOSIS — F411 Generalized anxiety disorder: Secondary | ICD-10-CM

## 2019-10-06 DIAGNOSIS — L659 Nonscarring hair loss, unspecified: Secondary | ICD-10-CM

## 2019-10-06 DIAGNOSIS — R7302 Impaired glucose tolerance (oral): Secondary | ICD-10-CM

## 2019-10-06 DIAGNOSIS — Z8669 Personal history of other diseases of the nervous system and sense organs: Secondary | ICD-10-CM

## 2019-10-06 DIAGNOSIS — F339 Major depressive disorder, recurrent, unspecified: Secondary | ICD-10-CM

## 2019-10-06 DIAGNOSIS — G43901 Migraine, unspecified, not intractable, with status migrainosus: Secondary | ICD-10-CM

## 2019-10-06 DIAGNOSIS — F439 Reaction to severe stress, unspecified: Secondary | ICD-10-CM

## 2019-10-06 DIAGNOSIS — Z8719 Personal history of other diseases of the digestive system: Secondary | ICD-10-CM

## 2019-10-06 DIAGNOSIS — E0500 Thyrotoxicosis with diffuse goiter without thyrotoxic crisis or storm: Secondary | ICD-10-CM

## 2019-10-06 NOTE — Patient Instructions (Signed)
Please call Dr. Casimiro Needle office for an appointment.  Please see endocrinologist regarding hyperthyroidism.  Note will be written to take you out of work for 30 days with reevaluation at that time.

## 2019-10-06 NOTE — Progress Notes (Signed)
Subjective:    Patient ID: Jordan Carrillo, female    DOB: April 20, 1961, 58 y.o.   MRN: [id number]  HPI 58 year old Female [employer] employee who has been under stress with personal and work issues for some time. Has had protracted headache despite being treated with Prednisone and Norco 5/325. She has hyperthyroidism but has refused to consider RAI treatment. Is supposed to be taking methimazole. Needs to go back and see Endocrinologist and this was re-iterated today.  Recent free T4 was normal and TSH was low at 0.37. she feels she is not being treated well at work.  She also has a history of inflammatory bowel disease.  She has impaired glucose tolerance with recent hemoglobin A1c 5.8%.  B12 and iron levels checked recently were normal.   Seen virtually today due to Coronavirus pandemic.  She is agreeable to visit in this format today.  She is identified using 2 identifiers as Jordan Carrillo-a patient in this practice.  Says she feels like she cannot continue to work at the present time due to stressful environment.  Feels that her paycheck is never correct although she has brought it to management's attention.  Has had considerable situational stress and personal life.  Her dog is ill.  She wants to sell her home.  She wants to go visit her brother in [place] for a while.  She would like to leave the job and be on leave for 3 months.  However, I think we can reevaluate her situation in 1 month.  She wants to go to [place] to see a female friend in the interim.  I am not sure it safe for her to travel with COVID-19 outbreak at the present time.  She looks agitated today and is crying.  Says she feels that the stress is too much to bear.  Does not have suicidal ideations.  Asking for a note to be out of work beginning November 30.  I have agreed to write her out of work for 1 month with brief evaluation.  In the interim, she needs to see a psychiatrist for evaluation.  We have provided a  phone number for her to call.  She also needs to go back and see endocrinologist.  She has 20 years of employment with [employer].  She really needs to think about getting out of the stressful environment as she is so distraught.  A letter has been prepared for her to pick up on Monday, November 30 excusing her from work for one month.  Copy of letter is in chart.  Review of Systems     Objective:   Physical Exam Seen virtually today.  Is crying and was crying and appears to be distraught.  Says she cannot take the stress any longer and is thinking about leaving her job.  I do not think she is suicidal.  She has proptosis and this is concerning.       Assessment & Plan:  Situational stress  Anxiety state-needs psychiatric evaluation  Protracted headache likely due to stress  History of migraine headaches and appears to have status migrainosus.  Perhaps when the stress has improved the migraines will improve.  I do not want to repeat prednisone at this time.  Hyperthyroidism-on methimazole and needs to see endocrinologist  Plan: See above she needs to see endocrinologist during this interim while she is out of work for 30 days.  She needs to consider radioactive iodine treatment.  This may  be contributing to her emotional lability.  She needs to see psychiatrist for evaluation.  She will be given a note to be out of work for 30 days with reevaluation at that time.  She really needs to get her thought process more concrete.  I am not sure it is wise for her to travel at the present time to [place] or [place].  25 minutes spent reviewing records seen patient and making recommendations as well as writing letter for her.

## 2019-10-11 NOTE — Patient Instructions (Signed)
Labs drawn and pending.  Take Xanax 0.5 mg twice daily for anxiety.  Recommend counseling for situational stress

## 2019-10-12 ENCOUNTER — Telehealth: Payer: Self-pay | Admitting: Internal Medicine

## 2019-10-12 ENCOUNTER — Ambulatory Visit: Payer: Federal, State, Local not specified - PPO | Admitting: Internal Medicine

## 2019-10-12 NOTE — Telephone Encounter (Signed)
Joshlynn [id number] [phone]  Jordan Carrillo called to say for the last week or so, she has been having left ear pain that is now runing down into her neck. Would like to be seen.

## 2019-10-12 NOTE — Telephone Encounter (Signed)
Appointment scheduled.

## 2019-10-12 NOTE — Telephone Encounter (Signed)
We can do a virtual visit tomorrow.

## 2019-10-13 ENCOUNTER — Ambulatory Visit (INDEPENDENT_AMBULATORY_CARE_PROVIDER_SITE_OTHER): Payer: Federal, State, Local not specified - PPO | Admitting: Internal Medicine

## 2019-10-13 ENCOUNTER — Encounter: Payer: Self-pay | Admitting: Internal Medicine

## 2019-10-13 ENCOUNTER — Other Ambulatory Visit: Payer: Self-pay

## 2019-10-13 VITALS — Ht 64.0 in | Wt 122.0 lb

## 2019-10-13 DIAGNOSIS — Z8669 Personal history of other diseases of the nervous system and sense organs: Secondary | ICD-10-CM

## 2019-10-13 DIAGNOSIS — F439 Reaction to severe stress, unspecified: Secondary | ICD-10-CM

## 2019-10-13 DIAGNOSIS — H6502 Acute serous otitis media, left ear: Secondary | ICD-10-CM | POA: Diagnosis not present

## 2019-10-13 MED ORDER — HYDROCODONE-ACETAMINOPHEN 5-325 MG PO TABS: ORAL_TABLET | ORAL | 0 refills | Status: DC

## 2019-10-13 MED ORDER — AZITHROMYCIN 250 MG PO TABS: ORAL_TABLET | ORAL | 0 refills | Status: DC

## 2019-10-13 MED ORDER — PREDNISONE 10 MG PO TABS: ORAL_TABLET | ORAL | 0 refills | Status: DC

## 2019-10-13 NOTE — Progress Notes (Signed)
   Subjective:    Patient ID: Jordan Carrillo, female    DOB: 01/25/1961, 58 y.o.   MRN: [id number]  HPI 58 year old Female with longstanding history of migraine headaches.Hx vertigo treated with meclizine march 2019.  She has a history of hypothyroidism and impaired glucose tolerance.  She is followed by endocrinologist.  She has history of Crohn's disease.  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  At that time had increased pressure in right eye of 25.  Have discussed with her having radioactive iodine treatment for hyperthyroidism.  She does not want to do that right now.  Says she will stay with methimazole and will follow-up with endocrinologist.  Continued situational stress at work.  Letter has been provided for leave of absence for 1 month.  She has been told by her supervisor as of yesterday she should look into applying for disability.  She says she is being watched at work by coworkers.  She feels that she cannot do her job because of this intense scrutiny.  Yesterday she had a severe headache.  It hurts to drive.  She could not see well to drive.  Still has some headache today but not as bad.  Complaining of left ear pain for several days.  Thinks she may have an ear infection.  Due to the coronavirus pandemic she is seen by [facility] today because of complaint of ear pain and possible respiratory infection.  She is identified as Sports coach patient in this practice using 2 identifiers.  She is seen virtually in her home.  She is agreeable to visit in this format today.      Review of Systems no protracted vomiting. Fatigue and feels stressed.     Objective:   Physical Exam HENT:     Head: Normocephalic and atraumatic.  Neurological:     Mental Status: She is alert.     Cranial Nerves: No cranial nerve deficit, dysarthria or facial asymmetry.   proptosis bilaterally. Affect is anxious when speaking  about work. Clearly in pain from headache. Looks fatigued.        Assessment & Plan:  Recurrent migraine headaches  Situational stress at work  Hyperthyroidism  Plan: Letter will be prepared for her to be out of work for 1 month with reassessment at that time.  She remains on Tapazole (methimazole) 20 mg daily.  Have prescribed Zithromax Z-PAK 2 tablets day 1 followed by 1 tablet days 2 through 5 for respiratory infection.  Previously prescribed Zofran in November for nausea.  Represcribed prednisone 10 mg (#21) to take in tapering course as directed for protracted migraine headache.  She may need to see neurologist if headaches persist.  Have prescribed Norco 5/325 1 tablet every 8 hours with food as needed for migraine headache pain with no refill number 15 tablets.  Continue Zoloft for depression.  Letter prepared at her request.  Copy is in chart.

## 2019-10-22 ENCOUNTER — Ambulatory Visit (INDEPENDENT_AMBULATORY_CARE_PROVIDER_SITE_OTHER): Payer: Federal, State, Local not specified - PPO | Admitting: Addiction (Substance Use Disorder)

## 2019-10-22 ENCOUNTER — Other Ambulatory Visit: Payer: Self-pay

## 2019-10-22 ENCOUNTER — Encounter: Payer: Self-pay | Admitting: Addiction (Substance Use Disorder)

## 2019-10-22 DIAGNOSIS — F331 Major depressive disorder, recurrent, moderate: Secondary | ICD-10-CM | POA: Diagnosis not present

## 2019-10-22 DIAGNOSIS — F4310 Post-traumatic stress disorder, unspecified: Secondary | ICD-10-CM | POA: Diagnosis not present
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
## 2019-10-28 NOTE — Patient Instructions (Addendum)
A letter has been prepared excusing her from work for 1 month beginning today due to situational stress at work and recurrent migraine headaches.  She agrees to return to see endocrinologist regarding hyperthyroidism.  Take Norco 5/325 sparingly for migraine headache pain.  Take tapering course of prednisone as directed.  Take Zithromax Z-PAK for respiratory congestion.  Continue methimazole for hyperthyroidism.  Continue Zoloft.

## 2019-10-29 ENCOUNTER — Ambulatory Visit (INDEPENDENT_AMBULATORY_CARE_PROVIDER_SITE_OTHER): Payer: Federal, State, Local not specified - PPO | Admitting: Addiction (Substance Use Disorder)

## 2019-10-29 ENCOUNTER — Other Ambulatory Visit: Payer: Self-pay

## 2019-10-29 DIAGNOSIS — F4310 Post-traumatic stress disorder, unspecified: Secondary | ICD-10-CM

## 2019-10-29 DIAGNOSIS — F331 Major depressive disorder, recurrent, moderate: Secondary | ICD-10-CM

## 2019-10-29 NOTE — Progress Notes (Signed)
Search with that one special thing that makes me appreciate that I am alive.Jordan Carrillo

## 2019-10-30 NOTE — Progress Notes (Signed)
[facility] Counselor/Therapist Progress Note     &   continuation of Initial Assessment of Client's Presenting Problem.  Patient ID: Carrillo Carrillo, MRN: [id number],    Date: 10/30/2019  Time Spent: 10:03-11:00 57 minutes [id number]  Treatment Type: Individual Therapy   Reported Symptoms: "had a panic attack this morning about leaving the house, but on the long drive over here with my dog Carrillo Carrillo, I calmed down." Battling with hopeless depressive thoughts. Anger and ruminations.   Continued presenting problem overall: Ruminations and anger related to suffering due to medical issues and then harrassment at work from boss and a couple coworkers who talked about her physical changes and how she is different and 'looks like she's not from here'.  Depressive thoughts and tearful when feeling like a reject. Emotionally dysregulated and not open to medication for MH at this time- not willing to take it.   Mental Status Exam:  Appearance:   Well Groomed     Behavior:  Sharing and Blaming  Motor:  Normal  Speech/Language:   Pressured  Affect:  Constricted and Tearful  Mood:  angry, anxious, depressed and irritable  Thought process:  flight of ideas  Thought content:    Obsessions and Rumination  Sensory/Perceptual disturbances:    Flashbacks related to comments from coworker's harrassment.  Orientation:  x4  Attention:  Good  Concentration:  Good  Memory:  WNL  Fund of knowledge:   Good  Insight:    Good  Judgment:   Good  Impulse Control:  Good   Risk Assessment: Danger to Self:  No Self-injurious Behavior: No Danger to Others: No Duty to Warn:no Physical Aggression / Violence:No  Access to Firearms a concern: No  Gang Involvement:No   Subjective: Carrillo Carrillo came into session today anxious and amped-up and reported having had a panic attack. Client processed the ruminations and flashbacks a part of MDD & PTSD that affect her. Client processed importance of her dog Carrillo Carrillo and since  she brought him into session. Therapist observed client begin to co-regulate with her dog who is stays calm and affectionate and pops up on her lap to gain her attention if she's panicking or flooding emotionally.  Therapist introduced Brainspotting and a tool called the Alpha-Stim that helped her brain begin to regulate to allow her body to feel safe processing the difficult memories/sensations challenging her wellbeing. Client SUDs dropped by 4 points out of 10 and was able to calm down her racing thoughts and verbal venting to and feel safe being still in her body. Then she was also able to listen to some psychoeducation about DBT emotion regulation skills and mindfulness as a method for riding the emotional wave out that makes her feel like she wont survive and 'cant live like this'. Client denied SI/HI/AVH and explained her passive desire not to have to live through the suffering but denied a desire to die. Client made progress in session by learning how to suit up for battle with her hopelessness and obsessive depressive thoughts. Client also processed her fear of not taking medication for MH at this time but coming weekly for therapy and seeing how that helps her learn to cope. Client also vulnerably shared what is in her subcortical brain/ somatic sensations and deep core beliefs that taunt her & was also able to verbalize a message of hope that she could tap in to her nervous system using "tapping". It was: "I can search inside myself each day for that one  special thing that makes me appreciate that I am alive. I always find one!" Client to return next week and practice body scans and tapping in that hopeful belief.    Interventions: Mindfulness Meditation, Motivational Interviewing, Humanistic/Existential, Narrative, Grief Therapy, Insight-Oriented and Brainspotting , Tapping Diagnosis:   ICD-10-CM   1. PTSD (post-traumatic stress disorder)  F43.10   2. Major depressive disorder, recurrent episode,  moderate ([place])  F33.1     Plan of Care: Client engaged in building therapeutic rapport with therapist. Client is to come to therapy weekly with Carrillo Carrillo; Client is to not return to work where she is being bullied; client is to continue in care with homeopath; Client is to practice mindfulness and DBT emotion regulation skills such as TRE(traumatic release exercises) and Brainspotting AEB engaging in it each morning to help regulate her nervous system that is in sympathetic overdrive (overstimulated/ hypervigilant). Client is to engage in [place] (BSP) with therapist to help learn emotion regulation AEB co-regulating in session with therapist, breathing calmly with therapist and learning to ride the emotional wave and see how she can survive and come out on the other side.  Plan of Care:  Client to engage in positive self talk and challenging negative internal ruminations and self talk causing her to be overly anxious and worried using CBT, on daily practice. Client to engage in mindfulness: ie body scans each eveneing to help process and discharge emotional distress & recognize emotions. Client to utilize BSP (brainspotting) with therapist to help client regulate her anxiety in a somatic- felt body sense way: ie by working to reduce muscle tension, rumination,  increased heart rate and constant worrying and feeling "hyper" with increased anxiety by 50% in the next 6 months.  Client to engage in tapping to tap in healthy cognition that challenges negative rumination or deep core belief.  Client to prioritize sleep 8+ hours each week night AEB going to bed by 10pm each night.   Barnie Del, LCSW, LCAS, CCTP, CCS-I, BSP

## 2019-11-03 ENCOUNTER — Other Ambulatory Visit: Payer: Self-pay

## 2019-11-03 ENCOUNTER — Ambulatory Visit (INDEPENDENT_AMBULATORY_CARE_PROVIDER_SITE_OTHER): Payer: Federal, State, Local not specified - PPO | Admitting: Addiction (Substance Use Disorder)

## 2019-11-03 DIAGNOSIS — F4310 Post-traumatic stress disorder, unspecified: Secondary | ICD-10-CM | POA: Diagnosis not present

## 2019-11-03 DIAGNOSIS — F331 Major depressive disorder, recurrent, moderate: Secondary | ICD-10-CM

## 2019-11-03 NOTE — Progress Notes (Signed)
[facility] Counselor/Therapist Progress Note     &   continuation of Initial Assessment of Client's Presenting Problem.  Patient ID: Jordan Carrillo, MRN: [id number],    Date: 11/03/2019  Time Spent: 10:03-11:00 57 minutes [id number]  Treatment Type: Individual Therapy   Reported Symptoms: "hopelessness, crying spells and on the drive here feeling out of control and panicky. Still having hopeless, depressive ruminations and feelings of being weak and unable to defend herself."  Mental Status Exam:  Appearance:   Fairly Groomed and Well Groomed     Behavior:  Care-Taking and Motivated to get better  Motor:  Normal  Speech/Language:   Normal Rate  Affect:  Congruent and Depressed until end of session, then smiling  Mood:  depressed  Thought process:  concrete and goal directed  Thought content:    Rumination and Abstract Reasoning  Sensory/Perceptual disturbances:    WNL and Flashbacks (some) related to comments from coworker's harrassment and feelings of rejection from father.  Orientation:  x4  Attention:  Good  Concentration:  Good  Memory:  WNL  Fund of knowledge:   Good  Insight:    Good  Judgment:   Good  Impulse Control:  Good   Risk Assessment: Danger to Self:  No Self-injurious Behavior: No Danger to Others: No Duty to Warn:no Physical Aggression / Violence:No  Access to Firearms a concern: No  Gang Involvement:No   Subjective: Jordan Carrillo more vulnerable sharing her hopelessness immediately today at start of session. Reported she had done some internal work and recognized the core issues and beliefs about herself. She feels like a reject & struggles thinking she's unloveable and someone who doesn't deserve to fight back and call other's cruelty out. Client processed the pain that came from talking with her brother who was shaming her for coming to therapy but was trying to help her through this episode of depression. Client shared how helping others helps her like she  did to help her brother in his suffering in the past, but client identified where she feels like she isnt worthy of others loving her well unless she stands up for herself pointing out to her brother her faithfulness to not leave him in the pit of depression. Client shared her desire to crawl out of this pit and therapist used BSP & MI & RPT with client to help her process these feelings, body sensations and distorted core beliefs. Client processed her SUDs of a 9 out of 10 and was able to get to a 2 out 10 (10 being the worst), towards the end of session processing from her resource spot and body resource called: precious moments of hope that she felt behind her neck that made her feel self-love, worthiness, and peace to be there and be compassionate for herself during this struggle with depression. Client experienced change talk and began to say she had made it out of the pit once before and could do it again! Client also believed that her brain could heal and got a warm rush of joyful/hopeful tears she reported. Client saw how God entered into her suffering and reported more resilience and a new positive cognitive belief. Client made much progress in session and expressed hope for future and no SI/HI/AVH.   Interventions: Mindfulness Meditation, Motivational Interviewing, Humanistic/Existential, Narrative, Grief Therapy, Insight-Oriented and Brainspotting , Tapping Diagnosis:   ICD-10-CM   1. PTSD (post-traumatic stress disorder)  F43.10   2. Major depressive disorder, recurrent episode, moderate (HCC)  F33.1  Plan of Care: Client engaged in building therapeutic rapport with therapist. Client is to come to therapy weekly with Sammuel Cooper; Client is to not return to work where she is being bullied; client is to continue in care with homeopath; Client is to practice mindfulness and DBT emotion regulation skills such as TRE(traumatic release exercises) and Brainspotting AEB engaging in it each morning to  help regulate her nervous system that is in sympathetic overdrive (overstimulated/ hypervigilant). Client is to engage in [place] (BSP) with therapist to help learn emotion regulation AEB co-regulating in session with therapist, breathing calmly with therapist and learning to ride the emotional wave and see how she can survive and come out on the other side.  Plan of Care:  Client to engage in positive self talk and challenging negative internal ruminations and self talk causing her to be overly anxious and worried using CBT, on daily practice. Client to engage in mindfulness: ie body scans each eveneing to help process and discharge emotional distress & recognize emotions. Client to utilize BSP (brainspotting) with therapist to help client regulate her anxiety in a somatic- felt body sense way: ie by working to reduce muscle tension, rumination,  increased heart rate and constant worrying and feeling "hyper" with increased anxiety by 50% in the next 6 months.  Client to engage in tapping to tap in healthy cognition that challenges negative rumination or deep core belief.  Client to prioritize sleep 8+ hours each week night AEB going to bed by 10pm each night.   Barnie Del, LCSW, LCAS, CCTP, CCS-I, BSP

## 2019-11-10 ENCOUNTER — Other Ambulatory Visit: Payer: Self-pay

## 2019-11-10 ENCOUNTER — Ambulatory Visit (INDEPENDENT_AMBULATORY_CARE_PROVIDER_SITE_OTHER): Payer: Federal, State, Local not specified - PPO | Admitting: Addiction (Substance Use Disorder)

## 2019-11-10 ENCOUNTER — Telehealth: Payer: Self-pay | Admitting: Internal Medicine

## 2019-11-10 DIAGNOSIS — F4310 Post-traumatic stress disorder, unspecified: Secondary | ICD-10-CM | POA: Diagnosis not present

## 2019-11-10 DIAGNOSIS — F331 Major depressive disorder, recurrent, moderate: Secondary | ICD-10-CM | POA: Diagnosis not present

## 2019-11-10 NOTE — Telephone Encounter (Signed)
We can do virtual visit  

## 2019-11-10 NOTE — Telephone Encounter (Signed)
Called and spoke with patient she is going to urgent care, she feels like someone needs to look at her ear, since this has been going on for awhile.

## 2019-11-10 NOTE — Telephone Encounter (Signed)
Jordan Carrillo [phone]  [name] called to say she has had headache since Christmas day and left ear pain, body aches. She thinks her fibromyalgia is acting up. Would like to come in, she is currently in town, just left another appointment.

## 2019-11-10 NOTE — Progress Notes (Signed)
[facility] Counselor/Therapist Progress Note     &   continuation of Initial Assessment of Client's Presenting Problem.  Patient ID: Jordan Carrillo, MRN: [id number],    Date: 11/10/2019  Time Spent: 9:02-10:00 58 minutes [id number]  Treatment Type: Individual Therapy   Reported Symptoms: "feels like going to explode inside because I feel like everyone is looking at me", crying spells, body stress and GI issues, anxious, and some hopelessness, depressive ruminations   Mental Status Exam:  Appearance:   Fairly Groomed     Behavior:  Sharing and Blaming  Motor:  Normal  Speech/Language:   Normal Rate  Affect:  Congruent and Depressed   Mood:  depressed  Thought process:  concrete and goal directed  Thought content:    Rumination and Abstract Reasoning  Sensory/Perceptual disturbances:    WNL  Orientation:  x4  Attention:  Good  Concentration:  Good  Memory:  WNL  Fund of knowledge:   Good  Insight:    Good  Judgment:   Good  Impulse Control:  Good   Risk Assessment: Danger to Self:  No Self-injurious Behavior: No Danger to Others: No Duty to Warn:no Physical Aggression / Violence:No  Access to Firearms a concern: No  Gang Involvement:No   Subjective: Jordan Carrillo came in with her support dog [name] to comfort her in session. Client more relaxed in session and open to processing without as much anger about depression and others' harrassment of her at work and mistreatment by her family. Client processed her decision-making process for returning to work or planning to sell her house and move to [place] to get more treatment for her thyroid. Client made a year goal to move to [place] and stay in a hotel with her support dog until she gets her surgery before moving to [place] with her friend. Client processed the last 5 years of depression and how she is healing her mind- praying and sitting and loving herself. She verbalized: Im not going back to that state! Im suffering right now around the  holidays and with fear of how to pay bills after leaving work due to the harrassment. Client shared how helping others helps her like she did to help her brother in his suffering in the past, but client identified where she feels like she isnt worthy of others loving her well unless she stands up for herself pointing out to her brother her faithfulness to not leave him in the pit of depression. Therapist used CBT, MI, and mindfulness to help client process negative thoughts and ruminations and calm her breathing and find a sense of peace. Following session, Client expressed hope for future and no SI/HI/AVH. Client shared her desire to crawl out of this pit and therapist used BSP & MI & RPT with client to help her process these feelings, body sensations and distorted core beliefs. Therapist used BSP and Mindfulness to help client find her body and resource spot again ie: precious moments of hope that come from her faith and live in her neck that make her feel compassionate towards herself. Client made progress by continuing to process her goals and compassion for herself to be gentle with herself and utilize self-care. Jordan Carrillo processed grief with therapist for her internal suffering. She reported I feel often like Im going to explode inside because I feel like everyone is looking at me and were running from me at my job and stuff. She reports having increased GI issues due to stress or chrons  disease. Client talked through her hopelessness and the judgement on her. She processed feeling cumbersome to her family but used tapping to tap in the thought challenging thought that she is worthy.  Interventions: Cognitive Behavioral Therapy, Mindfulness Meditation, Motivational Interviewing, Grief Therapy, Insight-Oriented and Brainspotting, Tapping Diagnosis:   ICD-10-CM   1. Major depressive disorder, recurrent episode, moderate (HCC)  F33.1   2. PTSD (post-traumatic stress disorder)  F43.10     Plan of Care:  Client engaged in building therapeutic rapport with therapist. Client is to come to therapy weekly with Sammuel Cooper; Client is to not return to work where she is being bullied; client is to continue in care with homeopath; Client is to practice mindfulness and DBT emotion regulation skills such as TRE(traumatic release exercises) and Brainspotting AEB engaging in it each morning to help regulate her nervous system that is in sympathetic overdrive (overstimulated/ hypervigilant). Client is to engage in [place] (BSP) with therapist to help learn emotion regulation AEB co-regulating in session with therapist, breathing calmly with therapist and learning to ride the emotional wave and see how she can survive and come out on the other side. Client to engage in positive self talk and challenging negative internal ruminations and self talk causing her to be overly anxious and worried using CBT, on daily practice.  Client to engage in tapping to tap in healthy cognition that challenges negative rumination or deep core belief.    Barnie Del, LCSW, LCAS, CCTP, CCS-I, BSP

## 2019-11-10 NOTE — Telephone Encounter (Signed)
Called patient to let her know she needs to go to urgent care with these symptoms, let her know about one at [facility].Then she stated she is out of her hydrocodone, and needs refill

## 2019-11-10 NOTE — Telephone Encounter (Signed)
Due to Covid, she cannot come in with headache and ear pain. I would advise that she go to an Urgent Care.

## 2019-11-17 ENCOUNTER — Ambulatory Visit: Payer: Federal, State, Local not specified - PPO | Admitting: Addiction (Substance Use Disorder)

## 2019-11-17 ENCOUNTER — Other Ambulatory Visit: Payer: Self-pay

## 2019-11-17 ENCOUNTER — Telehealth: Payer: Self-pay | Admitting: Internal Medicine

## 2019-11-17 ENCOUNTER — Ambulatory Visit (INDEPENDENT_AMBULATORY_CARE_PROVIDER_SITE_OTHER): Payer: Federal, State, Local not specified - PPO | Admitting: Addiction (Substance Use Disorder)

## 2019-11-17 DIAGNOSIS — F331 Major depressive disorder, recurrent, moderate: Secondary | ICD-10-CM | POA: Diagnosis not present

## 2019-11-17 DIAGNOSIS — F4310 Post-traumatic stress disorder, unspecified: Secondary | ICD-10-CM

## 2019-11-17 DIAGNOSIS — F329 Major depressive disorder, single episode, unspecified: Secondary | ICD-10-CM | POA: Diagnosis not present

## 2019-11-17 DIAGNOSIS — H6691 Otitis media, unspecified, right ear: Secondary | ICD-10-CM | POA: Diagnosis not present

## 2019-11-17 DIAGNOSIS — G44209 Tension-type headache, unspecified, not intractable: Secondary | ICD-10-CM | POA: Diagnosis not present

## 2019-11-17 NOTE — Telephone Encounter (Signed)
error 

## 2019-11-17 NOTE — Telephone Encounter (Signed)
Blakley called she went to [place] urgent care when advised to, they gave her amoxicillin, she has finished that. She went back to [facility]gent care today they tested her for COVID and it was negatitive.  She still continues to have ear pain, headache, and fibromyalgia flare up and wants to be seen in the office. I have ask her to have [facility] to fax [place] the notes from both visits and the result of her COVID test before we can see her in the office. She is out of the hydrocodone and is now on penicillin for the ear ache.

## 2019-11-17 NOTE — Progress Notes (Signed)
[facility] Counselor/Therapist Progress Note     &   continuation of Initial Assessment of Client's Presenting Problem.  Patient ID: Jordan Carrillo, MRN: [id number],    Date: 11/18/2019  Time Spent: 8:01-9:00 59 minutes  Treatment Type: Individual Therapy   Reported Symptoms: "hopeful, shaky from nerve damage, sad."  Mental Status Exam:  Appearance:   Casual     Behavior:  Appropriate  Motor:  Normal  Speech/Language:   Normal Rate  Affect:  Congruent and Restricted   Mood:  sad  Thought process:  normal  Thought content:    Rumination  Sensory/Perceptual disturbances:    WNL  Orientation:  x4  Attention:  Good  Concentration:  Good  Memory:  WNL  Fund of knowledge:   Good  Insight:    Good  Judgment:   Good  Impulse Control:  Good   Risk Assessment: Danger to Self:  No Self-injurious Behavior: No Danger to Others: No Duty to Warn:no Physical Aggression / Violence:No  Access to Firearms a concern: No  Gang Involvement:No   Subjective: Jordan Carrillo reviewed with therapist all health concerns that have caused her suffering and became tearful in session. Client processed examples of how her disorders have caused long-term issues she is having to accept that will not heal. Client verbalized a more specific timely goal to move to [place] for treatment for her thyroid. Client discussed being more hopeful that she can find some more help and validation for her health AEB processing her excitement of finding a doctor to take her case on and having her family in next week to help her pack up her things to move. Client processed her inability to move boxes herself from nerve damage, and her experience dropping them on her toe yesterday as a reminder. Client verbalized her worth and her vigor for life, sharing how hard it has been to feel hopeless, but her hope for how: "things are turning up". Client expressed past feelings of not wanting to be alive before starting therapy and how things  cycle for her mood, recognizing its her brain chemicals. Client denied SI/HI/AVH and no danger to self or others. Client also expressed a changed thought related to considering medication to help with her depression on days she's not feeling hopeful or motivation to do anything. 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    Interventions: Cognitive Behavioral Therapy, Motivational Interviewing, Grief Therapy and Insight-Oriented, Acceptance- Committment Therapy Diagnosis:   ICD-10-CM   1. Major depressive disorder, recurrent episode, moderate (HCC)  F33.1   2. PTSD (post-traumatic stress disorder)  F43.10     Plan of Care: Client engaged in building therapeutic rapport with therapist. Client is to come to therapy weekly with Sammuel Cooper; Client is to not return to work where she is being bullied; client is to continue in care with homeopath; Client is to practice mindfulness and DBT emotion regulation skills such as TRE(traumatic release exercises) and Brainspotting AEB engaging in it each morning to help regulate her nervous system that is in sympathetic overdrive (overstimulated/ hypervigilant). Client is to engage in [place] (BSP) with therapist to help learn emotion regulation AEB co-regulating in session with therapist, breathing calmly with therapist and learning to ride the emotional wave and see how she can survive and come out on the other side. Client to engage in positive self talk and challenging negative internal ruminations and self talk causing her  to be overly anxious and worried using CBT, on daily practice.  Client to engage in tapping to tap in healthy cognition that challenges negative rumination or deep core belief.    Pauline Good, LCSW, LCAS, CCTP, CCS-I, BSP

## 2019-11-18 ENCOUNTER — Ambulatory Visit: Payer: Federal, State, Local not specified - PPO | Admitting: Addiction (Substance Use Disorder)

## 2019-11-18 NOTE — Telephone Encounter (Signed)
Please call her. Have not seen records from [facility] yet

## 2019-11-18 NOTE — Telephone Encounter (Signed)
Called and spoke with patient she has not gotten in touch with [facility] yet to have them send [place] records. I did let her know once again that we could not see her until we receive these records.

## 2019-11-24 ENCOUNTER — Telehealth: Payer: Self-pay | Admitting: Internal Medicine

## 2019-11-24 NOTE — Telephone Encounter (Signed)
Faxed referral to Dr Corwin Levins - endocrinologist in [facility]

## 2019-11-24 NOTE — Telephone Encounter (Signed)
Jordan Carrillo [phone]  Rekia called to say she would like a referral to DR Corwin Levins endocrinologist in [facility], and if we put it in as urgent they will make her an appointment.She does not want to go to the one at [facility] anymore

## 2019-11-24 NOTE — Telephone Encounter (Addendum)
Patient called to schedule an appointment for Thursdau, I let her know we still have not received papers from the Urgent Care and we can not schedule an appointment till we receive it. She now needs FMLA paperwork filled out.

## 2019-11-24 NOTE — Telephone Encounter (Signed)
We will need at least virtual visit to go forward with more FMLA paperwork.

## 2019-11-24 NOTE — Telephone Encounter (Signed)
OK to make referral for hyperthyroidism. Notes are in Epic and they should be able to access on Care Everywhere

## 2019-11-24 NOTE — Telephone Encounter (Signed)
Schedule appointment for Thursday

## 2019-11-26 ENCOUNTER — Encounter: Payer: Self-pay | Admitting: Internal Medicine

## 2019-11-26 ENCOUNTER — Other Ambulatory Visit: Payer: Self-pay

## 2019-11-26 ENCOUNTER — Ambulatory Visit (INDEPENDENT_AMBULATORY_CARE_PROVIDER_SITE_OTHER): Payer: Federal, State, Local not specified - PPO | Admitting: Internal Medicine

## 2019-11-26 VITALS — BP 130/60 | HR 83 | Temp 98.0°F | Ht 64.0 in | Wt 127.0 lb

## 2019-11-26 DIAGNOSIS — R519 Headache, unspecified: Secondary | ICD-10-CM | POA: Diagnosis not present

## 2019-11-26 DIAGNOSIS — H9202 Otalgia, left ear: Secondary | ICD-10-CM

## 2019-11-26 DIAGNOSIS — H6505 Acute serous otitis media, recurrent, left ear: Secondary | ICD-10-CM

## 2019-11-26 MED ORDER — DOXYCYCLINE HYCLATE 100 MG PO TABS: 100.0000 mg | ORAL_TABLET | Freq: Two times a day (BID) | ORAL | 0 refills | Status: DC

## 2019-11-26 MED ORDER — NEOMYCIN-POLYMYXIN-HC 3.5-10000-1 OT SOLN: 4.0000 [drp] | Freq: Four times a day (QID) | OTIC | 0 refills | Status: DC

## 2019-11-26 MED ORDER — METHYLPREDNISOLONE ACETATE 80 MG/ML IJ SUSP
80.0000 mg | Freq: Once | INTRAMUSCULAR | Status: AC
  Administered 2019-11-26: 17:00:00 80 mg via INTRAMUSCULAR

## 2019-11-30 ENCOUNTER — Telehealth: Payer: Self-pay | Admitting: Internal Medicine

## 2019-11-30 NOTE — Telephone Encounter (Signed)
Called to speak with patient about FMLA forms, she stated there is 2 sets of FMLA paperwork and one of them is past due. I let her know that I only see one set, so she is going to bring me a copy of the set that needs to be updated.

## 2019-12-02 DIAGNOSIS — Z029 Encounter for administrative examinations, unspecified: Secondary | ICD-10-CM

## 2019-12-03 DIAGNOSIS — E0590 Thyrotoxicosis, unspecified without thyrotoxic crisis or storm: Secondary | ICD-10-CM | POA: Diagnosis not present

## 2019-12-03 DIAGNOSIS — E0500 Thyrotoxicosis with diffuse goiter without thyrotoxic crisis or storm: Secondary | ICD-10-CM | POA: Diagnosis not present

## 2019-12-06 NOTE — Progress Notes (Signed)
   Subjective:    Patient ID: Jordan Carrillo, female    DOB: October 02, 1961, 59 y.o.   MRN: [id number]  HPI 59 year old Female in today with intractable headache.  She has been out of work due to anxiety depression and stress at work.  We have provided note for her to be out of work December 1.  See office visit December 1.  At that time she agreed to see endocrinologist regarding hyperthyroidism.  She has not done it yet.  She was treated with prednisone and Zithromax for respiratory congestion.  She has begun to see counselor, Zoila Shutter and has had several visits.  I am not able to locate those notes but apparently has been diagnosed with PTSD and major depressive disorder.  These are the diagnoses she has been given in epic.  Has had considerable issues with recurrent headache.  This seems to be stress related.  At last visit I understand there was a possibility that she might go to [place] to be with her brother.  Apparently he had to go to [place] for work.  That is not going to happen at present time.  She also talked about moving to [place] or visiting in [place].  She talks about selling her home.  Says that home needs some repairs.    Review of Systems she has a history of glucose intolerance, anxiety state, depression, situational stress, hyperthyroidism/Graves' disease, migraine headaches, complains of hair loss and has a history of inflammatory bowel disease.  Complains of being anxious.  Complains of left ear pain     Objective:   Physical Exam She is afebrile.  Blood pressure 130/60 pulse 83 and regular weight 127 pounds.  She last weighed here in November and was 122 pounds. Has tenderness in left external ear canal.  Fullness in left TM but not radiated pharynx is clear.  Neck is supple.  PERRLA and extraocular movements are full.     Assessment & Plan:  Status migrainosus /protracted headache  Persistent situational stress-now in counseling  Hyperthyroidism/Graves' disease-  has not seen endocrinologist-remains on methimazole  Depression  ?  Left external otitis-treatment with Cortisporin otic suspension 4 drops 4 times a day for 5 days.  Probable left serous otitis media.  Needs to have FMLA extended apparently.  Has been speaking with attorney regarding her work situation.  Plan: Continue Zoloft 100 mg daily.  Cortisporin otic suspension 4 drops 4 times a day for 5 days.  Depo-Medrol 80 mg IM for protracted headache.  Doxycycline 100 mg twice daily for 10 days for complaint of ear pain.

## 2019-12-06 NOTE — Patient Instructions (Signed)
Depo-Medrol 80 mg IM.  Doxycycline 100 mg twice daily for 10 days.  Continue Zoloft.  Cortisporin otic suspension 4 drops 4 times a day for 5 days.

## 2019-12-09 NOTE — Telephone Encounter (Signed)
Completed FMLA paperwork and faxed to post office. Mailed originals to patient.

## 2019-12-16 ENCOUNTER — Other Ambulatory Visit: Payer: Self-pay

## 2019-12-16 ENCOUNTER — Ambulatory Visit (INDEPENDENT_AMBULATORY_CARE_PROVIDER_SITE_OTHER): Payer: Federal, State, Local not specified - PPO | Admitting: Physician Assistant

## 2019-12-16 ENCOUNTER — Encounter: Payer: Self-pay | Admitting: Physician Assistant

## 2019-12-16 VITALS — BP 123/83 | HR 81 | Ht 64.0 in | Wt 122.0 lb

## 2019-12-16 DIAGNOSIS — F4310 Post-traumatic stress disorder, unspecified: Secondary | ICD-10-CM

## 2019-12-16 DIAGNOSIS — F411 Generalized anxiety disorder: Secondary | ICD-10-CM

## 2019-12-16 DIAGNOSIS — F331 Major depressive disorder, recurrent, moderate: Secondary | ICD-10-CM | POA: Diagnosis not present

## 2019-12-16 MED ORDER — SERTRALINE HCL 100 MG PO TABS: 150.0000 mg | ORAL_TABLET | Freq: Every day | ORAL | 1 refills | Status: DC

## 2019-12-16 MED ORDER — ALPRAZOLAM 0.5 MG PO TABS: 0.5000 mg | ORAL_TABLET | Freq: Three times a day (TID) | ORAL | 1 refills | Status: DC | PRN
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
## 2019-12-28 DIAGNOSIS — E0500 Thyrotoxicosis with diffuse goiter without thyrotoxic crisis or storm: Secondary | ICD-10-CM | POA: Diagnosis not present

## 2019-12-28 DIAGNOSIS — R5383 Other fatigue: Secondary | ICD-10-CM | POA: Diagnosis not present

## 2019-12-28 DIAGNOSIS — E0590 Thyrotoxicosis, unspecified without thyrotoxic crisis or storm: Secondary | ICD-10-CM | POA: Diagnosis not present

## 2019-12-28 DIAGNOSIS — K5090 Crohn's disease, unspecified, without complications: Secondary | ICD-10-CM | POA: Diagnosis not present

## 2019-12-30 ENCOUNTER — Telehealth: Payer: Self-pay

## 2019-12-30 NOTE — Telephone Encounter (Signed)
Patient called states after finishing her prednisone the headache came back it won't go away she takes motrin daily and that helps some. She said is a throbbing headache and she would like to know what to do to stop it.

## 2020-01-05 ENCOUNTER — Telehealth: Payer: Self-pay | Admitting: Internal Medicine

## 2020-01-05 ENCOUNTER — Encounter: Payer: Self-pay | Admitting: Internal Medicine

## 2020-01-05 NOTE — Telephone Encounter (Addendum)
Anuja MNOTRRN [phone]  Zan called to say she still has headache and ear pain, she has made an earlier appointment with a different neurologists that could get her in sooner.  (01/12/2020). She also said that [facility] did another Lyme Disease screening on her and they said she was positive for it. I ask her to have them fax office notes and results to Dr Lenord Fellers. Patient also said that here FMLA paperwork was denied so she tore them up and threw them away. She said that pharmacist gave her some sudafed and she now has a rash and her head is hurting really bad. She just does not know what to do.    Reply: The patient should not be taking Sudafed with hyperthyroidism. This is contraindicated. She sees [facility] in [place] for holistic type treatments. Her main issue is hyperthyroidism in addition to anxiety and depression. I am concerned the headaches are migraine in nature. She has had these for years and she was supposed to be seeing Dr.Patel, at [facility] neurology in mid-March about them. I also think she should consider radioactive iodine treatment for hyperthyroidism. However she has been unwilling to consider that so it is difficult to sort all of these issues out.She did recently see new Endocrinologist, Dr. Katrinka Blazing in [facility] and review of labs still is consistent with hyperthyroidism with low TSH. These issues have been ongoing now for an extended period of time. MJB.MD

## 2020-01-07 DIAGNOSIS — R0789 Other chest pain: Secondary | ICD-10-CM | POA: Diagnosis not present

## 2020-01-07 NOTE — Telephone Encounter (Signed)
Thanks for the info.  I see her next week for Psych med management. Melony Overly, PA-C

## 2020-01-12 ENCOUNTER — Encounter: Payer: Self-pay | Admitting: Physician Assistant

## 2020-01-12 ENCOUNTER — Ambulatory Visit (INDEPENDENT_AMBULATORY_CARE_PROVIDER_SITE_OTHER): Payer: Federal, State, Local not specified - PPO | Admitting: Physician Assistant

## 2020-01-12 ENCOUNTER — Other Ambulatory Visit: Payer: Self-pay

## 2020-01-12 DIAGNOSIS — H524 Presbyopia: Secondary | ICD-10-CM | POA: Diagnosis not present

## 2020-01-12 DIAGNOSIS — G4459 Other complicated headache syndrome: Secondary | ICD-10-CM | POA: Diagnosis not present

## 2020-01-12 DIAGNOSIS — F331 Major depressive disorder, recurrent, moderate: Secondary | ICD-10-CM

## 2020-01-12 DIAGNOSIS — Z789 Other specified health status: Secondary | ICD-10-CM

## 2020-01-12 DIAGNOSIS — G4700 Insomnia, unspecified: Secondary | ICD-10-CM | POA: Diagnosis not present

## 2020-01-12 DIAGNOSIS — E0590 Thyrotoxicosis, unspecified without thyrotoxic crisis or storm: Secondary | ICD-10-CM | POA: Diagnosis not present

## 2020-01-12 DIAGNOSIS — E0500 Thyrotoxicosis with diffuse goiter without thyrotoxic crisis or storm: Secondary | ICD-10-CM | POA: Diagnosis not present

## 2020-01-12 DIAGNOSIS — F411 Generalized anxiety disorder: Secondary | ICD-10-CM

## 2020-01-12 DIAGNOSIS — H52203 Unspecified astigmatism, bilateral: Secondary | ICD-10-CM | POA: Diagnosis not present

## 2020-01-12 DIAGNOSIS — H5203 Hypermetropia, bilateral: Secondary | ICD-10-CM | POA: Diagnosis not present

## 2020-01-12 DIAGNOSIS — H25813 Combined forms of age-related cataract, bilateral: Secondary | ICD-10-CM | POA: Diagnosis not present

## 2020-01-12 DIAGNOSIS — F339 Major depressive disorder, recurrent, unspecified: Secondary | ICD-10-CM | POA: Diagnosis not present

## 2020-01-12 MED ORDER — TRAZODONE HCL 50 MG PO TABS: 25.0000 mg | ORAL_TABLET | Freq: Every evening | ORAL | 0 refills | Status: DC | PRN

## 2020-01-12 NOTE — Progress Notes (Signed)
[facility] Med Check  Patient ID: Jordan Carrillo,  MRN: [id number]  PCP: Elby Showers, MD  Date of Evaluation: 01/12/2020 Time spent:30 minutes  Chief Complaint:  Chief Complaint    Depression; Anxiety      HISTORY/CURRENT STATUS: HPI For routine med check   Has had a very bad month. Several health problems.  See ROS. Scared she may have thyroid cancer. And then someone told her she might have brain tumor.  Has appointment with neurology later today.  Doesn't really feel any better since we increased the Zoloft last month.  "But it is been such a bad month it is hard to tell."  A month ago, I discontinued the Ativan and gave her more Xanax for as needed use.  States she is unable to tell if it is working better or not.  It is not helping her sleep, that is for sure.  States she does not sleep much at all during the night or daytime.  She does not take naps.  She feels "wired and hyped up" every night.  She was taking Sudafed last week for an earache but she has not taken it in almost a week.  She is not sure if that caused the insomnia or not, but she is still not sleeping since she stopped it.  Once she gets to sleep, she will sleep for a few hours but also has early morning awakening and does not go back to sleep.  Has anhedonia, low motivation but yet she feels hyper, she does cry easily but states the chronic headache has just drained her and makes her feel terrible, with hopeless feelings that she is not going to get better and something may be seriously wrong.  Appetite is normal, no weight gain or loss.  States she forgets things a lot like her mind just goes "blank."  She will go to the refrigerator for example and forget what she is doing there.  Another example is having her phone in her hand, planning to text someone and then her mind goes blank, not knowing what to do with the phone.  This has worsened over the past month since she has had the daily headache.  She denies  suicidal or homicidal thoughts.  Denies decreased need for sleep, no impulsivity or risky behavior, no increased libido or increased spending, no hallucinations, no grandiosity.  She takes oral meds for the hyperthyroidism.  States she has seen an endocrinologist before but then got a second opinion.  Feels that the second person did not check her, ordering any labs or scans and "did not tell me anything."  Not sure if she has further appointments with any endocrinologist. She also sees [facility].  Review of Systems  Constitutional: Positive for malaise/fatigue.  HENT: Positive for congestion.   Eyes: Positive for blurred vision.  Respiratory: Negative.   Cardiovascular: Positive for palpitations.  Gastrointestinal: Negative.   Genitourinary: Negative.   Musculoskeletal: Negative.   Skin: Positive for itching.  Neurological: Positive for dizziness and headaches.  Endo/Heme/Allergies: Negative.   Psychiatric/Behavioral: Positive for depression and memory loss. Negative for hallucinations, substance abuse and suicidal ideas. The patient is nervous/anxious and has insomnia.    Individual Medical History/ Review of Systems: Changes? :Yes   had dizziness and chest pain last week.  Went to Urgent Care, EKG and CXR was nl. Also has itching on left ear, chest and neck and right arm.  Using Benadryl.  Helps a little. Has discussed w/ her  PCP.  Chronic headaches. Has had one for a month now.  Has appt w/ Neurology today.  Past medications for mental health diagnoses include: Ativan, Zoloft  Allergies: Prednisone and Latex  Current Medications:  Current Outpatient Medications:  .  ALPRAZolam (XANAX) 0.5 MG tablet, Take 1 tablet (0.5 mg total) by mouth 3 (three) times daily as needed for anxiety., Disp: 90 tablet, Rfl: 1 .  APRISO 0.375 g 24 hr capsule, Take 4 tablets by mouth daily., Disp: , Rfl:  .  diphenhydrAMINE (SOMINEX) 25 MG tablet, Take 25 mg by mouth in the morning,  at noon, in the evening, and at bedtime., Disp: , Rfl:  .  ibuprofen (ADVIL) 200 MG tablet, Take 200 mg by mouth every 6 (six) hours as needed., Disp: , Rfl:  .  mesalamine (ROWASA) 4 g enema, Place 4 g rectally as needed., Disp: , Rfl:  .  methimazole (TAPAZOLE) 10 MG tablet, TAKE 2 TABLETS(20 MG) BY MOUTH DAILY, Disp: 180 tablet, Rfl: 0 .  neomycin-polymyxin-hydrocortisone (CORTISPORIN) OTIC solution, Place 4 drops into the left ear 4 (four) times daily., Disp: 10 mL, Rfl: 0 .  sertraline (ZOLOFT) 100 MG tablet, Take 1.5 tablets (150 mg total) by mouth daily., Disp: 45 tablet, Rfl: 1 .  doxycycline (VIBRA-TABS) 100 MG tablet, Take 1 tablet (100 mg total) by mouth 2 (two) times daily. (Patient not taking: Reported on 01/12/2020), Disp: 20 tablet, Rfl: 0 .  meloxicam (MOBIC) 15 MG tablet, Take 15 mg by mouth daily., Disp: , Rfl:  .  ondansetron (ZOFRAN) 4 MG tablet, Take 1 tablet (4 mg total) by mouth every 8 (eight) hours as needed for nausea or vomiting. (Patient not taking: Reported on 12/16/2019), Disp: 20 tablet, Rfl: 0 .  traZODone (DESYREL) 50 MG tablet, Take 0.5-1 tablets (25-50 mg total) by mouth at bedtime as needed for sleep., Disp: 60 tablet, Rfl: 0 Medication Side Effects: none  Family Medical/ Social History: Changes? No  MENTAL HEALTH EXAM:  Last menstrual period 01/18/2016.There is no height or weight on file to calculate BMI.  General Appearance: Casual, Neat, Well Groomed and exophthalmus  Eye Contact:  Good  Speech:  Clear and Coherent and Normal Rate  Volume:  Normal  Mood:  Depressed  Affect:  Depressed, Tearful and consolable  Thought Process:  Goal Directed and Descriptions of Associations: Intact  Orientation:  Full (Time, Place, and Person)  Thought Content: Logical   Suicidal Thoughts:  No  Homicidal Thoughts:  No  Memory:  WNL  Judgement:  Good  Insight:  Good  Psychomotor Activity:  Normal  Concentration:  Concentration: Good and Attention Span: Fair  Recall:   Good  Fund of Knowledge: Good  Language: Good  Assets:  Desire for Improvement  ADL's:  Intact  Cognition: WNL  Prognosis:  Good    DIAGNOSES:    ICD-10-CM   1. Major depressive disorder, recurrent episode, moderate (HCC)  F33.1   2. Generalized anxiety disorder  F41.1   3. Insomnia, unspecified type  G47.00   4. Hyperthyroidism  E05.90   5. Poor historian  Z78.9     Receiving Psychotherapy: Yes  Zoila Shutter LCSW   RECOMMENDATIONS:  PDMP was reviewed.   I spent 30 minutes with her. Patient states she may be taking her medications wrong.  Even the thyroid medications.  It is difficult to know for sure.  I wrote specific instructions for her mental health medications on the AVS and told her to follow those instructions. I  encouraged her to get back in with her PCP to discuss any of the acute problems. She will be seeing neurology later today. I recommend that she see endocrinology again.  I suggested that she stay with one doctor and not bounce around, especially since she already has a second opinion concerning her hyperthyroidism.  I strongly recommend that she follow the endocrinologist recommendations. Some of the anxiety that she feels is due to the hyperthyroidism.  Until that is controlled, the medications that I prescribe are unlikely to be effective.  She verbalizes understanding. As far as the sleep goes, we discussed sleep hygiene.  I am prescribing trazodone to try and help with that.  Adequate sleep can at least help some of her problems, and may be with the headache, anxiety, and depression. Continue Xanax 0.5 mg, 1 p.o. 3 times daily as needed. Continue Zoloft 100 mg, 1.5 pills daily routinely. Start trazodone 50 mg, 1/2-1 nightly as needed. Continue therapy with Zoila Shutter, LCSW. Return in 4 weeks.   Melony Overly, PA-C

## 2020-01-12 NOTE — Patient Instructions (Addendum)
On the Xanax (alprazolam) take 1 pill, three times a day as needed.  This is for anxiety and panic attacks.   Continue the Zoloft 100mg , 1 1/2 pills (1.5 pills) The new medicine is Trazodone, take as directed on the bottle for sleep.

## 2020-01-18 DIAGNOSIS — H9202 Otalgia, left ear: Secondary | ICD-10-CM | POA: Diagnosis not present

## 2020-01-18 DIAGNOSIS — K5090 Crohn's disease, unspecified, without complications: Secondary | ICD-10-CM | POA: Diagnosis not present

## 2020-01-18 DIAGNOSIS — R5383 Other fatigue: Secondary | ICD-10-CM | POA: Diagnosis not present

## 2020-01-18 DIAGNOSIS — E0500 Thyrotoxicosis with diffuse goiter without thyrotoxic crisis or storm: Secondary | ICD-10-CM | POA: Diagnosis not present

## 2020-01-18 DIAGNOSIS — E0590 Thyrotoxicosis, unspecified without thyrotoxic crisis or storm: Secondary | ICD-10-CM | POA: Diagnosis not present

## 2020-01-19 ENCOUNTER — Ambulatory Visit: Payer: Federal, State, Local not specified - PPO | Admitting: Physician Assistant

## 2020-01-20 ENCOUNTER — Telehealth: Payer: Self-pay | Admitting: Internal Medicine

## 2020-01-20 NOTE — Telephone Encounter (Signed)
Faxed 49 pages of office notes to [facility] to fax number  [phone] along with their cover sheet. Case Number [id number]

## 2020-01-25 ENCOUNTER — Ambulatory Visit: Payer: Federal, State, Local not specified - PPO | Admitting: Neurology

## 2020-01-25 DIAGNOSIS — H9202 Otalgia, left ear: Secondary | ICD-10-CM | POA: Diagnosis not present

## 2020-01-25 DIAGNOSIS — R519 Headache, unspecified: Secondary | ICD-10-CM | POA: Diagnosis not present

## 2020-02-09 ENCOUNTER — Other Ambulatory Visit: Payer: Self-pay

## 2020-02-09 ENCOUNTER — Encounter: Payer: Self-pay | Admitting: Physician Assistant

## 2020-02-09 ENCOUNTER — Ambulatory Visit (INDEPENDENT_AMBULATORY_CARE_PROVIDER_SITE_OTHER): Payer: Federal, State, Local not specified - PPO | Admitting: Physician Assistant

## 2020-02-09 DIAGNOSIS — G4700 Insomnia, unspecified: Secondary | ICD-10-CM

## 2020-02-09 DIAGNOSIS — F4310 Post-traumatic stress disorder, unspecified: Secondary | ICD-10-CM | POA: Diagnosis not present

## 2020-02-09 DIAGNOSIS — E0590 Thyrotoxicosis, unspecified without thyrotoxic crisis or storm: Secondary | ICD-10-CM

## 2020-02-09 DIAGNOSIS — F331 Major depressive disorder, recurrent, moderate: Secondary | ICD-10-CM | POA: Diagnosis not present

## 2020-02-09 DIAGNOSIS — F411 Generalized anxiety disorder: Secondary | ICD-10-CM | POA: Diagnosis not present

## 2020-02-09 MED ORDER — TRAZODONE HCL 100 MG PO TABS: 100.0000 mg | ORAL_TABLET | Freq: Every evening | ORAL | 1 refills | Status: DC | PRN
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
## 2020-02-17 DIAGNOSIS — K5010 Crohn's disease of large intestine without complications: Secondary | ICD-10-CM | POA: Diagnosis not present

## 2020-02-17 DIAGNOSIS — K219 Gastro-esophageal reflux disease without esophagitis: Secondary | ICD-10-CM | POA: Diagnosis not present

## 2020-02-18 ENCOUNTER — Telehealth: Payer: Self-pay

## 2020-02-18 NOTE — Telephone Encounter (Signed)
Scheduled appointment

## 2020-02-18 NOTE — Telephone Encounter (Signed)
Patient went to see her gastroenterologist yesterday and was told to follow up here for low blood pressure reading she said it was 94/60 she is feeling fatigued, she said she is going to be in town next Friday and she would like to come in then.

## 2020-02-18 NOTE — Telephone Encounter (Signed)
Appt next Friday then

## 2020-02-26 ENCOUNTER — Other Ambulatory Visit: Payer: Self-pay

## 2020-02-26 ENCOUNTER — Encounter: Payer: Self-pay | Admitting: Internal Medicine

## 2020-02-26 ENCOUNTER — Ambulatory Visit (INDEPENDENT_AMBULATORY_CARE_PROVIDER_SITE_OTHER): Payer: Federal, State, Local not specified - PPO | Admitting: Internal Medicine

## 2020-02-26 VITALS — BP 100/70 | HR 76 | Temp 98.6°F | Ht 64.0 in | Wt 135.0 lb

## 2020-02-26 DIAGNOSIS — I951 Orthostatic hypotension: Secondary | ICD-10-CM | POA: Diagnosis not present

## 2020-02-26 DIAGNOSIS — Z0289 Encounter for other administrative examinations: Secondary | ICD-10-CM

## 2020-02-26 DIAGNOSIS — F439 Reaction to severe stress, unspecified: Secondary | ICD-10-CM

## 2020-02-26 DIAGNOSIS — F339 Major depressive disorder, recurrent, unspecified: Secondary | ICD-10-CM | POA: Diagnosis not present

## 2020-02-26 DIAGNOSIS — F411 Generalized anxiety disorder: Secondary | ICD-10-CM

## 2020-02-26 DIAGNOSIS — E0590 Thyrotoxicosis, unspecified without thyrotoxic crisis or storm: Secondary | ICD-10-CM

## 2020-02-26 DIAGNOSIS — G4459 Other complicated headache syndrome: Secondary | ICD-10-CM | POA: Diagnosis not present

## 2020-02-27 ENCOUNTER — Telehealth: Payer: Self-pay | Admitting: Internal Medicine

## 2020-02-27 NOTE — Telephone Encounter (Signed)
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 These were sent Certified/ Signed receipt requested. A copy of receipt is in chart. A copy of letter I wrote is in chart.

## 2020-02-28 ENCOUNTER — Encounter: Payer: Self-pay | Admitting: Internal Medicine

## 2020-02-28 NOTE — Progress Notes (Signed)
   Subjective:    Patient ID: Jordan Carrillo, female    DOB: Jun 15, 1961, 59 y.o.   MRN: [id number]  HPI We received a call from patient on April 8 saying she was at Gastroenterologist on April 7 and that BP was low at 94/60. She was complaining of fatigue. She wanted to come in today.   Patient continues with work issues. She has requested her records be sent to [place]. She requests a statement be sent regarding her work status. She is trying to obtain retirement benefits. She says [employer] personnel told her they lost her paperwork that she had turned in previously. She says her brother has come to stay with her. She says she has no money and goes nowhere. Says her life centers around her dog. She is tearful in the office today.Feels that she is not being treated fairly by [employer]. Remains out of work due to anxiety and depression. Has hyperthyroidism treated with methimazole. Have recommended ablation but she has not wanted to do that. Has seen [name] for medication and counseling.  She is on Xanax 0.5 mg up to 3 times a day, methimazole 10 mg 2 tablets daily, Zoloft 150 mg daily trazodone 100 to 200 mg at bedtime and Effexor 150 mg daily.  Her labs were last checked in November 2020.  TSH was slightly low at 0.27 and free T4 was normal.  B12, iron iron-binding capacity normal.  Hemoglobin A1c very slightly elevated at 5.8%.  C-Met normal except for glucose of 107.   Review of Systems see above     Objective:   Physical Exam Blood pressure today lying is 100/60, standing is 100/60 standing is 80/60  Pulse is 76 pulse oximetry 99% She has proptosis.   She is tearful at times in the office today.    Assessment & Plan:  I spent 20 minutes speaking with her today at length about her situation.  We will need to copy records for [employer].  She says I need to write a letter indicating that she is disabled at this point in time.  I do believe she is disabled from a mental standpoint and is unable to  perform her job duties.  I would recommend that she not return to work at this time.  All of this is been going on for a number of months and clearly this job is stressful for her and she is not up to it at the present time.  Her blood pressure is low and I think that is due to orthostasis from the medication she is on.  I would recommend she contact [name] for an adjustment in her medications.  We have taken the packet of records to [place] and mailed with certified son receipt requested on Saturday, April 17.  Receipt obtained for  postage and certified signed receipt requested.  Once again I have asked her to consider ablation therapy for hyperthyroidism.  She may need adjustment in her antidepressant and anxiety medications due to orthostasis.

## 2020-02-28 NOTE — Patient Instructions (Signed)
Letter written the patient is currently disabled for [place] resources department as patient requested.  Letter along with her medical records mailed as requested.  Suggest patient contact [facility] regarding orthostasis and adjustment in antianxiety and antidepressant medications.

## 2020-02-29 NOTE — Telephone Encounter (Signed)
Thank you :)

## 2020-03-02 DIAGNOSIS — E0500 Thyrotoxicosis with diffuse goiter without thyrotoxic crisis or storm: Secondary | ICD-10-CM | POA: Diagnosis not present

## 2020-03-07 ENCOUNTER — Ambulatory Visit: Payer: Federal, State, Local not specified - PPO | Admitting: Physician Assistant

## 2020-03-09 ENCOUNTER — Telehealth: Payer: Self-pay | Admitting: Physician Assistant

## 2020-03-09 NOTE — Telephone Encounter (Signed)
Tried to reach patient, no answer and no voicemail. Will try again later.

## 2020-03-10 NOTE — Telephone Encounter (Signed)
Patient aware to decrease her Zoloft to 100 mg and to set up an apt for follow up. Advised to call if having any issues.

## 2020-03-24 ENCOUNTER — Telehealth: Payer: Self-pay | Admitting: Internal Medicine

## 2020-03-24 NOTE — Telephone Encounter (Signed)
Pt calling to try and make an appt, She said she has been feeling dizzy a lot lately, mainly when she moves her head,She said certain movement makes it worse. Its been going on for a couple of weeks but she said last week was worse and that it kept her in bed all week

## 2020-03-24 NOTE — Telephone Encounter (Signed)
Pt not able to do tomorrow at 4 so she will come either Monday or was advised if she gets worse over the weekend to go to ER

## 2020-03-24 NOTE — Telephone Encounter (Signed)
This sounds like Vertigo. Can see tomorrow.

## 2020-03-24 NOTE — Telephone Encounter (Signed)
Left message to call me back, can see her tomorrow at 4:00pm

## 2020-03-25 ENCOUNTER — Ambulatory Visit: Payer: Federal, State, Local not specified - PPO | Admitting: Internal Medicine

## 2020-03-28 ENCOUNTER — Ambulatory Visit: Payer: Federal, State, Local not specified - PPO | Admitting: Internal Medicine

## 2020-03-28 ENCOUNTER — Other Ambulatory Visit: Payer: Self-pay

## 2020-03-28 ENCOUNTER — Encounter: Payer: Self-pay | Admitting: Internal Medicine

## 2020-03-28 VITALS — BP 110/80 | HR 84 | Temp 97.7°F | Ht 64.0 in | Wt 136.0 lb

## 2020-03-28 DIAGNOSIS — R42 Dizziness and giddiness: Secondary | ICD-10-CM

## 2020-03-28 MED ORDER — MECLIZINE HCL 25 MG PO TABS: 25.0000 mg | ORAL_TABLET | Freq: Two times a day (BID) | ORAL | 0 refills | Status: AC | PRN

## 2020-03-28 NOTE — Patient Instructions (Signed)
Consider Social Services for assistance such as food stamps. Labs drawn and pending for dizziness. Take Antivert twice daily as needed. I am recommending radioactive iodine treatment for hyperthyroidism and psychiatric treatment perhaps in-patient for symptoms that are not improving with current medications. May need another legal opinion through [facility] cost [facility] or another law firm.
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
## 2020-03-29 LAB — CBC WITH DIFFERENTIAL/PLATELET
Absolute Monocytes: 504 cells/uL (ref 200–950)
Basophils Absolute: 29 cells/uL (ref 0–200)
Basophils Relative: 0.3 %
Eosinophils Absolute: 155 cells/uL (ref 15–500)
Eosinophils Relative: 1.6 %
HCT: 36.8 % (ref 35.0–45.0)
Hemoglobin: 12.2 g/dL (ref 11.7–15.5)
Lymphs Abs: 3240 cells/uL (ref 850–3900)
MCH: 31.2 pg (ref 27.0–33.0)
MCHC: 33.2 g/dL (ref 32.0–36.0)
MCV: 94.1 fL (ref 80.0–100.0)
MPV: 9.7 fL (ref 7.5–12.5)
Monocytes Relative: 5.2 %
Neutro Abs: 5772 cells/uL (ref 1500–7800)
Neutrophils Relative %: 59.5 %
Platelets: 327 10*3/uL (ref 140–400)
RBC: 3.91 10*6/uL (ref 3.80–5.10)
RDW: 11.9 % (ref 11.0–15.0)
Total Lymphocyte: 33.4 %
WBC: 9.7 10*3/uL (ref 3.8–10.8)

## 2020-03-29 LAB — BASIC METABOLIC PANEL
BUN: 15 mg/dL (ref 7–25)
CO2: 26 mmol/L (ref 20–32)
Calcium: 9.5 mg/dL (ref 8.6–10.4)
Chloride: 104 mmol/L (ref 98–110)
Creat: 0.81 mg/dL (ref 0.50–1.05)
Glucose, Bld: 105 mg/dL — ABNORMAL HIGH (ref 65–99)
Potassium: 3.9 mmol/L (ref 3.5–5.3)
Sodium: 139 mmol/L (ref 135–146)

## 2020-04-05 DIAGNOSIS — R1110 Vomiting, unspecified: Secondary | ICD-10-CM | POA: Diagnosis not present

## 2020-04-05 DIAGNOSIS — E0500 Thyrotoxicosis with diffuse goiter without thyrotoxic crisis or storm: Secondary | ICD-10-CM | POA: Diagnosis not present

## 2020-04-05 DIAGNOSIS — H05243 Constant exophthalmos, bilateral: Secondary | ICD-10-CM | POA: Diagnosis not present

## 2020-04-05 DIAGNOSIS — R5383 Other fatigue: Secondary | ICD-10-CM | POA: Diagnosis not present

## 2020-04-05 DIAGNOSIS — F4323 Adjustment disorder with mixed anxiety and depressed mood: Secondary | ICD-10-CM | POA: Diagnosis not present

## 2020-04-05 DIAGNOSIS — D509 Iron deficiency anemia, unspecified: Secondary | ICD-10-CM | POA: Diagnosis not present

## 2020-04-18 DIAGNOSIS — Z029 Encounter for administrative examinations, unspecified: Secondary | ICD-10-CM

## 2020-04-18 NOTE — Telephone Encounter (Signed)
Received payment from [employer]

## 2020-04-27 ENCOUNTER — Other Ambulatory Visit: Payer: Self-pay

## 2020-04-27 DIAGNOSIS — E0590 Thyrotoxicosis, unspecified without thyrotoxic crisis or storm: Secondary | ICD-10-CM

## 2020-04-27 NOTE — Telephone Encounter (Signed)
Because of hypotension, we decreased Zoloft to 100 mg  daily in April. Please change Rx to that. Also, she was supposed to follow up with Melony Overly at [facility].

## 2020-04-27 NOTE — Telephone Encounter (Signed)
Patient called to request refills, she said she had a headache last week but it went away and she thinks is related to allergies she wants to know if you can recommend something for allergies?

## 2020-04-27 NOTE — Telephone Encounter (Signed)
We suggest Claritin or Zyrtec. Zyrtec may cause drowsiness so take it at night. Can try Flonase nasal spray. These are over the counter.

## 2020-05-03 ENCOUNTER — Telehealth (INDEPENDENT_AMBULATORY_CARE_PROVIDER_SITE_OTHER): Payer: Federal, State, Local not specified - PPO | Admitting: Internal Medicine

## 2020-05-03 ENCOUNTER — Encounter: Payer: Self-pay | Admitting: Internal Medicine

## 2020-05-03 DIAGNOSIS — Z8669 Personal history of other diseases of the nervous system and sense organs: Secondary | ICD-10-CM | POA: Diagnosis not present

## 2020-05-03 DIAGNOSIS — F411 Generalized anxiety disorder: Secondary | ICD-10-CM

## 2020-05-03 DIAGNOSIS — F439 Reaction to severe stress, unspecified: Secondary | ICD-10-CM

## 2020-05-03 DIAGNOSIS — E0500 Thyrotoxicosis with diffuse goiter without thyrotoxic crisis or storm: Secondary | ICD-10-CM

## 2020-05-03 MED ORDER — METHIMAZOLE 10 MG PO TABS: ORAL_TABLET | ORAL | 0 refills | Status: AC

## 2020-05-03 MED ORDER — SERTRALINE HCL 100 MG PO TABS: 100.0000 mg | ORAL_TABLET | Freq: Every day | ORAL | 1 refills | Status: DC

## 2020-05-03 NOTE — Telephone Encounter (Signed)
Patient called today asking for appointment. I connected with her by phone. Says she remains under a lot of stress. Has run out of Zoloft and methimazole. Apparently I failed to sign refill orders last week. I asked her to re-connect with counselor. Melony Overly. I have refilled these meds today. I don't think she needs an appointment. 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 She needs to settle situation with post office. It has been ongoing for a number of years. The job is not a good fit for her. She says last week she applied for some assistance and was turned down. Time spent reviewing records and speaking with patient personally by phone is 15 minutes.

## 2020-05-04 DIAGNOSIS — R1110 Vomiting, unspecified: Secondary | ICD-10-CM | POA: Diagnosis not present

## 2020-05-04 DIAGNOSIS — R52 Pain, unspecified: Secondary | ICD-10-CM | POA: Diagnosis not present

## 2020-05-04 DIAGNOSIS — E0500 Thyrotoxicosis with diffuse goiter without thyrotoxic crisis or storm: Secondary | ICD-10-CM | POA: Diagnosis not present

## 2020-05-04 DIAGNOSIS — F4323 Adjustment disorder with mixed anxiety and depressed mood: Secondary | ICD-10-CM | POA: Diagnosis not present

## 2020-05-04 DIAGNOSIS — D509 Iron deficiency anemia, unspecified: Secondary | ICD-10-CM | POA: Diagnosis not present

## 2020-05-04 DIAGNOSIS — E7800 Pure hypercholesterolemia, unspecified: Secondary | ICD-10-CM | POA: Diagnosis not present

## 2020-06-20 DIAGNOSIS — E7800 Pure hypercholesterolemia, unspecified: Secondary | ICD-10-CM | POA: Diagnosis not present

## 2020-06-20 DIAGNOSIS — E0500 Thyrotoxicosis with diffuse goiter without thyrotoxic crisis or storm: Secondary | ICD-10-CM | POA: Diagnosis not present

## 2020-06-20 DIAGNOSIS — F4323 Adjustment disorder with mixed anxiety and depressed mood: Secondary | ICD-10-CM | POA: Diagnosis not present

## 2020-07-14 DIAGNOSIS — E0590 Thyrotoxicosis, unspecified without thyrotoxic crisis or storm: Secondary | ICD-10-CM | POA: Diagnosis not present

## 2020-09-20 DIAGNOSIS — E0500 Thyrotoxicosis with diffuse goiter without thyrotoxic crisis or storm: Secondary | ICD-10-CM | POA: Diagnosis not present

## 2020-09-20 DIAGNOSIS — E7800 Pure hypercholesterolemia, unspecified: Secondary | ICD-10-CM | POA: Diagnosis not present

## 2020-09-20 DIAGNOSIS — F4323 Adjustment disorder with mixed anxiety and depressed mood: Secondary | ICD-10-CM | POA: Diagnosis not present

## 2020-09-20 DIAGNOSIS — D509 Iron deficiency anemia, unspecified: Secondary | ICD-10-CM | POA: Diagnosis not present

## 2020-09-22 ENCOUNTER — Other Ambulatory Visit: Payer: Self-pay

## 2020-09-22 ENCOUNTER — Ambulatory Visit (INDEPENDENT_AMBULATORY_CARE_PROVIDER_SITE_OTHER): Payer: Federal, State, Local not specified - PPO | Admitting: Physician Assistant

## 2020-09-22 ENCOUNTER — Encounter: Payer: Self-pay | Admitting: Physician Assistant

## 2020-09-22 VITALS — BP 123/77 | HR 79

## 2020-09-22 DIAGNOSIS — F411 Generalized anxiety disorder: Secondary | ICD-10-CM | POA: Diagnosis not present

## 2020-09-22 DIAGNOSIS — Z79899 Other long term (current) drug therapy: Secondary | ICD-10-CM

## 2020-09-22 DIAGNOSIS — G4700 Insomnia, unspecified: Secondary | ICD-10-CM

## 2020-09-22 DIAGNOSIS — F250 Schizoaffective disorder, bipolar type: Secondary | ICD-10-CM

## 2020-09-22 DIAGNOSIS — F4310 Post-traumatic stress disorder, unspecified: Secondary | ICD-10-CM

## 2020-09-22 MED ORDER — ALPRAZOLAM 0.5 MG PO TABS: 0.5000 mg | ORAL_TABLET | Freq: Three times a day (TID) | ORAL | 1 refills | Status: DC | PRN

## 2020-09-22 MED ORDER — QUETIAPINE FUMARATE ER 150 MG PO TB24: 150.0000 mg | ORAL_TABLET | Freq: Every day | ORAL | 1 refills | Status: DC

## 2020-09-22 MED ORDER — SERTRALINE HCL 100 MG PO TABS: 100.0000 mg | ORAL_TABLET | Freq: Every day | ORAL | 1 refills | Status: DC
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
## 2020-10-01 DIAGNOSIS — F419 Anxiety disorder, unspecified: Secondary | ICD-10-CM | POA: Diagnosis not present

## 2020-10-01 DIAGNOSIS — I10 Essential (primary) hypertension: Secondary | ICD-10-CM | POA: Diagnosis not present

## 2020-10-01 DIAGNOSIS — R9431 Abnormal electrocardiogram [ECG] [EKG]: Secondary | ICD-10-CM | POA: Diagnosis not present

## 2020-10-01 DIAGNOSIS — R0789 Other chest pain: Secondary | ICD-10-CM | POA: Diagnosis not present

## 2020-10-03 DIAGNOSIS — E785 Hyperlipidemia, unspecified: Secondary | ICD-10-CM | POA: Diagnosis not present

## 2020-10-03 DIAGNOSIS — R079 Chest pain, unspecified: Secondary | ICD-10-CM | POA: Diagnosis not present

## 2020-10-14 DIAGNOSIS — M545 Low back pain, unspecified: Secondary | ICD-10-CM | POA: Diagnosis not present

## 2020-10-14 DIAGNOSIS — M1711 Unilateral primary osteoarthritis, right knee: Secondary | ICD-10-CM | POA: Diagnosis not present

## 2020-10-14 DIAGNOSIS — M25561 Pain in right knee: Secondary | ICD-10-CM | POA: Diagnosis not present

## 2020-10-21 ENCOUNTER — Telehealth: Payer: Self-pay | Admitting: Internal Medicine

## 2020-10-21 NOTE — Telephone Encounter (Signed)
Called and spoke with patient, her FMLA has been denied. Her disability has also been denied. She would like an office visit to discuss getting a letter to ask the disability board to please reconsider her case.

## 2020-10-21 NOTE — Telephone Encounter (Signed)
[name] [phone]  Jordan Carrillo has called to see if you would fill out her FMLA paperwork again, it is due.

## 2020-10-21 NOTE — Telephone Encounter (Signed)
Has she spoken with an attorney? If she cannot afford one, perhaps someone at [facility] can help her or Legal Aid may can help her. Can Jordan Carrillo (her counselor with [facility]) help her with a letter? Her medical issues such as thyroid and GI have not really been the issue.

## 2020-10-21 NOTE — Telephone Encounter (Signed)
Attempted to call patient and she does not have VM set up.

## 2020-10-24 NOTE — Telephone Encounter (Signed)
This encounter was created in error - please disregard.

## 2020-10-27 DIAGNOSIS — M25561 Pain in right knee: Secondary | ICD-10-CM | POA: Diagnosis not present

## 2020-10-27 DIAGNOSIS — M1711 Unilateral primary osteoarthritis, right knee: Secondary | ICD-10-CM | POA: Diagnosis not present

## 2020-11-02 DIAGNOSIS — K5010 Crohn's disease of large intestine without complications: Secondary | ICD-10-CM | POA: Diagnosis not present

## 2020-11-02 DIAGNOSIS — K921 Melena: Secondary | ICD-10-CM | POA: Diagnosis not present

## 2020-11-08 ENCOUNTER — Ambulatory Visit (INDEPENDENT_AMBULATORY_CARE_PROVIDER_SITE_OTHER): Payer: Federal, State, Local not specified - PPO | Admitting: Physician Assistant

## 2020-11-08 ENCOUNTER — Other Ambulatory Visit: Payer: Self-pay

## 2020-11-08 ENCOUNTER — Encounter: Payer: Self-pay | Admitting: Physician Assistant

## 2020-11-08 DIAGNOSIS — R443 Hallucinations, unspecified: Secondary | ICD-10-CM

## 2020-11-08 DIAGNOSIS — F411 Generalized anxiety disorder: Secondary | ICD-10-CM | POA: Diagnosis not present

## 2020-11-08 DIAGNOSIS — F4310 Post-traumatic stress disorder, unspecified: Secondary | ICD-10-CM

## 2020-11-08 DIAGNOSIS — F250 Schizoaffective disorder, bipolar type: Secondary | ICD-10-CM

## 2020-11-08 DIAGNOSIS — Z789 Other specified health status: Secondary | ICD-10-CM

## 2020-11-08 DIAGNOSIS — G4700 Insomnia, unspecified: Secondary | ICD-10-CM

## 2020-11-08 DIAGNOSIS — Z79899 Other long term (current) drug therapy: Secondary | ICD-10-CM

## 2020-11-08 MED ORDER — QUETIAPINE FUMARATE ER 150 MG PO TB24: 150.0000 mg | ORAL_TABLET | Freq: Every day | ORAL | 1 refills | Status: DC

## 2020-11-08 MED ORDER — SERTRALINE HCL 100 MG PO TABS: 100.0000 mg | ORAL_TABLET | Freq: Every day | ORAL | 1 refills | Status: DC

## 2020-11-08 MED ORDER — ALPRAZOLAM 0.5 MG PO TABS: 0.5000 mg | ORAL_TABLET | Freq: Three times a day (TID) | ORAL | 1 refills | Status: DC | PRN

## 2020-11-08 NOTE — Progress Notes (Signed)
[facility] Med Check  Patient ID: Jordan Carrillo,  MRN: [id number]  PCP: Margaree Mackintosh, MD  Date of Evaluation: 11/08/2020 Time spent:40 minutes  Chief Complaint:  Chief Complaint    Anxiety; Depression; Follow-up      HISTORY/CURRENT STATUS: HPI Routine med check.   States she's seen Ortho and Cardiology since LOV. States the Ortho said she doesn't need to be on Seroquel or Crestor b/c it makes her hurt. (I don't see any notes on that. See on chart.)  She is only taking 75 mg of the Seroquel because she does not want to hurt.  I am unable to ascertain whether that has helped her pain or not.  As is common for her the history is quite scattered.  Reports feeling bad all over.  "I am tired of feeling like this."  We discussed her mental health and again, obtaining a current history is difficult.  When asked specifically about depression, states that she is really sad a lot.  She does not have a lot to do so has a hard time enjoying things.  Energy and motivation are fair to good.  Not crying as much as she did. Feels hopeless sometimes but then states she is excited about the possibility of going to [place] to be with her family.  Not working, feels that she needs to be on "disability retirement."  She was denied when filed for it in the past.  Request a note from me stating that she is a patient here. She has trouble falling asleep and staying asleep.    States she continues to hear things like someone knocking on the door, but she knows that no one is there or else her dog would be barking.  Hears a lot of chaos in her mind like people talking but she is unable to decipher anything specific that is said.  She also feels like someone is following her all the time.  No visual hallucinations.  She remains suspicious of people.  Most of the time she is unable to put her finger on because of suspicion.  The paranoia and hallucinations do not get worse with the mood changes.  They can occur  at any time.  Patient denies increased energy with decreased need for sleep, no increased talkativeness, no racing thoughts, no impulsivity or risky behaviors, no increased spending, no increased libido, no grandiosity, no increased irritability or anger, and no hallucinations.  Denies dizziness, syncope, seizures, numbness, tingling, tremor, tics, unsteady gait, slurred speech, confusion. Denies muscle or joint pain, stiffness, or dystonia. Denies unexplained weight loss, frequent infections, or sores that heal slowly.  No polyphagia, polydipsia, or polyuria. Denies visual changes or paresthesias.   Individual Medical History/ Review of Systems: Changes? :Yes  had CP, went to PCP, dx with anxiety and chest wall pain. See HPI.   Past medications for mental health diagnoses include: Ativan, Zoloft, Trazodone caused hypotension  Allergies: Prednisone and Latex  Current Medications:  Current Outpatient Medications:  .  mesalamine (ROWASA) 4 g enema, Place rectally., Disp: , Rfl:  .  methimazole (TAPAZOLE) 10 MG tablet, TAKE 2 TABLETS(20 MG) BY MOUTH DAILY, Disp: 180 tablet, Rfl: 0 .  pantoprazole (PROTONIX) 40 MG tablet, Take by mouth., Disp: , Rfl:  .  ALPRAZolam (XANAX) 0.5 MG tablet, Take 1 tablet (0.5 mg total) by mouth 3 (three) times daily as needed for anxiety., Disp: 90 tablet, Rfl: 1 .  meclizine (ANTIVERT) 25 MG tablet, Take 1 tablet (25 mg total) by  mouth 2 (two) times daily as needed for dizziness. (Patient not taking: No sig reported), Disp: 30 tablet, Rfl: 0 .  QUEtiapine Fumarate (SEROQUEL XR) 150 MG 24 hr tablet, Take 1 tablet (150 mg total) by mouth at bedtime., Disp: 30 tablet, Rfl: 1 .  sertraline (ZOLOFT) 100 MG tablet, Take 1 tablet (100 mg total) by mouth daily., Disp: 30 tablet, Rfl: 1 Medication Side Effects: none  Family Medical/ Social History: Changes? No  MENTAL HEALTH EXAM:  Last menstrual period 01/18/2016.There is no height or weight on file to calculate BMI.   General Appearance: Casual, Neat, Well Groomed and Bilateral exophthalmos  Eye Contact:  Good  Speech:  Clear and Coherent and Normal Rate  Volume:  Normal  Mood:  Depressed  Affect:  Depressed and Tearful  Thought Process:  Goal Directed and Descriptions of Associations: Intact  Orientation:  Full (Time, Place, and Person)  Thought Content: Logical   Suicidal Thoughts:  No  Homicidal Thoughts:  No  Memory:  WNL  Judgement:  Good  Insight:  Good  Psychomotor Activity:  Normal  Concentration:  Concentration: Good  Recall:  Good  Fund of Knowledge: Good  Language: Good  Assets:  Desire for Improvement  ADL's:  Intact  Cognition: WNL  Prognosis:  Good    DIAGNOSES:    ICD-10-CM   1. Schizoaffective disorder, bipolar type (HCC)  F25.0   2. Generalized anxiety disorder  F41.1   3. PTSD (post-traumatic stress disorder)  F43.10   4. Poor historian  Z78.9   5. Insomnia, unspecified type  G47.00   6. Encounter for long-term (current) use of medications  Z79.899 Comprehensive metabolic panel    Hemoglobin A1c    Lipid panel  7. Hallucinations  R44.3     Receiving Psychotherapy: Yes   Jordan Carrillo  RECOMMENDATIONS:  PDMP reviewed. I provided 40 minutes of face-to-face time during this encounter, in which we discussed her diagnosis of schizoaffective disorder and the need for an antipsychotic.  I reviewed her cardiologist note and there was nothing said in the plan concerning the Seroquel.  I am unable to find a note from orthopedist.  I reminded the patient that due to the hallucinations and paranoia, it is imperative that she takes an antipsychotic.  Seroquel can affect QT interval and I feel that the cardiologist would be the one to let me know if that drug is inappropriate for her at this time.  And at this point, they have not mentioned anything to me, but I am more than happy to change antipsychotics if they do recommend that.  I chose Seroquel because it can also help her  sleep.  She is willing to increase it now, and is aware that even the 150 mg is considered a low dose, we may need to increase it.  We again discussed the fact that this can increase glucose and/or lipids.  She did not have labs drawn as recommended at that last visit so I have reordered them.  I have explained that she needs to have them done fasting.  As requested, a note written on prescription stating "I see her for schizoaffective disorder, anxiety, insomnia, and PTSD.  Sincerely Melony Overly PA-C." Increase Seroquel XR to 150 mg, 1 p.o. nightly evening, and approximately 30 minutes to 1 hour prior to wanting to go to sleep. Continue Xanax 0.5 mg 1 p.o. 3 times daily as needed. Continue Zoloft 100 mg, 1 pills daily. Draw labs as noted above, baseline for  her taking Seroquel. Strongly recommend she get back into counseling with Jordan Shutter, LCSW. Return in 6 weeks.  Melony Overly, PA-C

## 2020-11-14 DIAGNOSIS — Z0000 Encounter for general adult medical examination without abnormal findings: Secondary | ICD-10-CM | POA: Diagnosis not present

## 2020-11-14 DIAGNOSIS — E7800 Pure hypercholesterolemia, unspecified: Secondary | ICD-10-CM | POA: Diagnosis not present

## 2020-11-14 DIAGNOSIS — Z8041 Family history of malignant neoplasm of ovary: Secondary | ICD-10-CM | POA: Diagnosis not present

## 2020-11-14 DIAGNOSIS — Z01419 Encounter for gynecological examination (general) (routine) without abnormal findings: Secondary | ICD-10-CM | POA: Diagnosis not present

## 2020-11-14 DIAGNOSIS — Z131 Encounter for screening for diabetes mellitus: Secondary | ICD-10-CM | POA: Diagnosis not present

## 2020-11-15 DIAGNOSIS — Z8041 Family history of malignant neoplasm of ovary: Secondary | ICD-10-CM | POA: Diagnosis not present

## 2020-11-21 ENCOUNTER — Telehealth: Payer: Self-pay | Admitting: Physician Assistant

## 2020-11-21 NOTE — Telephone Encounter (Signed)
On last visit, 12/28, pt needed a note for work. She got one from you but lost it. Can she please get that note and we need to let her know when it is ready.

## 2020-11-21 NOTE — Telephone Encounter (Signed)
Note written and put in front office box

## 2020-11-30 ENCOUNTER — Telehealth: Payer: Self-pay | Admitting: Internal Medicine

## 2020-11-30 NOTE — Telephone Encounter (Signed)
Faxed medical records (11 pages) 11/13/2019 to 11/30/2020) to [facility] [phone]

## 2020-12-02 DIAGNOSIS — R079 Chest pain, unspecified: Secondary | ICD-10-CM | POA: Diagnosis not present

## 2020-12-07 DIAGNOSIS — F4323 Adjustment disorder with mixed anxiety and depressed mood: Secondary | ICD-10-CM | POA: Diagnosis not present

## 2020-12-07 DIAGNOSIS — E7800 Pure hypercholesterolemia, unspecified: Secondary | ICD-10-CM | POA: Diagnosis not present

## 2020-12-07 DIAGNOSIS — R059 Cough, unspecified: Secondary | ICD-10-CM | POA: Diagnosis not present

## 2020-12-15 NOTE — Telephone Encounter (Signed)
[facility] - received Medical records

## 2020-12-26 ENCOUNTER — Ambulatory Visit: Payer: Federal, State, Local not specified - PPO | Admitting: Physician Assistant

## 2021-01-11 ENCOUNTER — Telehealth: Payer: Self-pay | Admitting: Internal Medicine

## 2021-01-11 NOTE — Telephone Encounter (Signed)
Jordan Carrillo [phone]  [name] called to say she had received a letter about her disability retirement application. She said they were asking for a letter from you. I ask her to bring letter to office, so we could see exactly what they were asking for.

## 2021-01-11 NOTE — Telephone Encounter (Signed)
Jordan Carrillo dropped off letter from 01/05/2021, the best I can tell they are wanting updated medical records documenting patient medical condition as to why she is not able to work and what treatments she is undergoing, any test that she has proving her conditions. Plus she is supposed to sit down and go over these things with her doctors, and get back to them within 30 days of this letter.

## 2021-01-12 NOTE — Telephone Encounter (Signed)
Unable to reach patient, trying to call to let her know that Dr Lenord Fellers does not have anything else to add, to send to the post office for her disability case, she has not been seen in 10 months.

## 2021-01-12 NOTE — Telephone Encounter (Signed)
Her current treatments are being provided by Melony Overly and Endocrinologist. We have not seen her since June.

## 2021-01-13 ENCOUNTER — Telehealth: Payer: Self-pay | Admitting: Physician Assistant

## 2021-01-13 NOTE — Telephone Encounter (Signed)
Pt dropped off [place] [employer] form to be completed. Records will need to be sent in as well.

## 2021-01-17 ENCOUNTER — Encounter: Payer: Self-pay | Admitting: Internal Medicine

## 2021-01-17 NOTE — Telephone Encounter (Signed)
Faxed [phone] last 2 office notes to [facility] with a letter, Attn Gwendalyn Ege.

## 2021-01-26 ENCOUNTER — Ambulatory Visit (INDEPENDENT_AMBULATORY_CARE_PROVIDER_SITE_OTHER): Payer: Self-pay | Admitting: Physician Assistant

## 2021-01-26 ENCOUNTER — Other Ambulatory Visit: Payer: Self-pay

## 2021-01-26 ENCOUNTER — Encounter: Payer: Self-pay | Admitting: Physician Assistant

## 2021-01-26 DIAGNOSIS — F332 Major depressive disorder, recurrent severe without psychotic features: Secondary | ICD-10-CM

## 2021-01-26 DIAGNOSIS — F250 Schizoaffective disorder, bipolar type: Secondary | ICD-10-CM

## 2021-01-26 DIAGNOSIS — G4700 Insomnia, unspecified: Secondary | ICD-10-CM

## 2021-01-26 DIAGNOSIS — F4310 Post-traumatic stress disorder, unspecified: Secondary | ICD-10-CM

## 2021-01-26 DIAGNOSIS — F411 Generalized anxiety disorder: Secondary | ICD-10-CM

## 2021-01-26 MED ORDER — ALPRAZOLAM 0.5 MG PO TABS: 0.5000 mg | ORAL_TABLET | Freq: Three times a day (TID) | ORAL | 1 refills | Status: AC | PRN

## 2021-01-26 MED ORDER — SERTRALINE HCL 100 MG PO TABS: 150.0000 mg | ORAL_TABLET | Freq: Every day | ORAL | 1 refills | Status: AC

## 2021-01-26 MED ORDER — QUETIAPINE FUMARATE ER 300 MG PO TB24: 300.0000 mg | ORAL_TABLET | Freq: Every day | ORAL | 1 refills | Status: AC

## 2021-01-26 NOTE — Progress Notes (Signed)
[facility] Med Check  Patient ID: Jordan Carrillo,  MRN: [id number]  PCP: Elby Showers, MD  Date of Evaluation: 01/26/2021 Time spent:40 minutes  Chief Complaint:  Chief Complaint    Anxiety; Depression; Stress      HISTORY/CURRENT STATUS: HPI Routine med check.   A lot of stress. Car broke down. Told by her landlord she had to move. Currently staying with a friend from work. Her insurance was cancelled. Not working since 09/2019. States she's being investigated by work, but she doesn't know why. She gets threatening letters from them, telling her they were going to terminate her.  She was on FMLA, not disability. Has officially retired from that place. She didn't have any money but [employer] gave her a loan last week.   "Everything that can go wrong did go wrong."  Very anxious. Has racing thoughts all the time. Panic attacks almost daily with chest tightness, has trouble swallowing when she has the chest tightness, has been to urgent care several times in the past month. Saw Cardiology a few months ago and Echo was done. Has a f/u appt next week.   Does not enjoy anything.  Energy and motivation are very low.  Feels beaten down.  Cries easily.  Isolates, feels hopeless.  Appetite is normal and weight is stable.  Denies suicidal or homicidal thoughts.  Patient denies increased energy with decreased need for sleep, no increased talkativeness, no impulsivity or risky behaviors, no increased spending, no increased libido, no grandiosity, no increased irritability or anger, no paranoia and does not report hearing someone knocking on the door like she has in the past with auditory hallucinations.  Increasing the Seroquel has helped that. No visual hallucinations.  Denies dizziness, syncope, seizures, numbness, tingling, tremor, tics, unsteady gait, slurred speech, confusion. Denies muscle or joint pain, stiffness, or dystonia. Denies unexplained weight loss, frequent infections,  or sores that heal slowly.  No polyphagia, polydipsia, or polyuria. Denies visual changes or paresthesias.   Individual Medical History/ Review of Systems: Changes? :No    Past medications for mental health diagnoses include: Ativan, Zoloft, Trazodone caused hypotension  Allergies: Prednisone and Latex  Current Medications:  Current Outpatient Medications:  .  Acetaminophen-Codeine 300-30 MG tablet, TAKE 1 TO 2 TABLETS BY MOUTH TWICE DAILY TO THREE TIMES DAILY WHEN NECESSARY, Disp: , Rfl:  .  mesalamine (ROWASA) 4 g enema, Place rectally., Disp: , Rfl:  .  methimazole (TAPAZOLE) 10 MG tablet, TAKE 2 TABLETS(20 MG) BY MOUTH DAILY, Disp: 180 tablet, Rfl: 0 .  pantoprazole (PROTONIX) 40 MG tablet, Take by mouth., Disp: , Rfl:  .  QUEtiapine (SEROQUEL XR) 300 MG 24 hr tablet, Take 1 tablet (300 mg total) by mouth at bedtime., Disp: 30 tablet, Rfl: 1 .  rosuvastatin (CRESTOR) 10 MG tablet, Take 10 mg by mouth at bedtime., Disp: , Rfl:  .  ALPRAZolam (XANAX) 0.5 MG tablet, Take 1 tablet (0.5 mg total) by mouth 3 (three) times daily as needed for anxiety., Disp: 90 tablet, Rfl: 1 .  meclizine (ANTIVERT) 25 MG tablet, Take 1 tablet (25 mg total) by mouth 2 (two) times daily as needed for dizziness. (Patient not taking: No sig reported), Disp: 30 tablet, Rfl: 0 .  sertraline (ZOLOFT) 100 MG tablet, Take 1.5 tablets (150 mg total) by mouth daily., Disp: 45 tablet, Rfl: 1 Medication Side Effects: none  Family Medical/ Social History: Changes?  See HPI  MENTAL HEALTH EXAM:  Last menstrual period 01/18/2016.There is no height  or weight on file to calculate BMI.  General Appearance: Casual, Neat, Well Groomed and Bilateral exophthalmos  Eye Contact:  Good  Speech:  Clear and Coherent and Normal Rate  Volume:  Normal  Mood:  Depressed  Affect:  Depressed and Tearful  Thought Process:  Goal Directed and Descriptions of Associations: Intact  Orientation:  Full (Time, Place, and Person)  Thought  Content: Logical   Suicidal Thoughts:  No  Homicidal Thoughts:  No  Memory:  WNL  Judgement:  Good  Insight:  Good  Psychomotor Activity:  Normal  Concentration:  Concentration: Good  Recall:  Good  Fund of Knowledge: Good  Language: Good  Assets:  Desire for Improvement  ADL's:  Intact  Cognition: WNL  Prognosis:  Good   Labs on 11/14/2020  CMP glucose was normal, BUN 14, creatinine 0.74, electrolytes were normal, alk phos 89, AST 16, ALT 16.   CBC White blood cells 9.2, hemoglobin 12.6, hematocrit 37.9, platelet count 363. Lipid panel shows cholesterol 314, triglycerides 270 HDL 55, LDL 205 not fasting. Hemoglobin A1c 6.3, CA-125 6.7 TSH 1.14    DIAGNOSES:    ICD-10-CM   1. Severe episode of recurrent major depressive disorder, without psychotic features ([place])  F33.2   2. Schizoaffective disorder, bipolar type ([place])  F25.0   3. PTSD (post-traumatic stress disorder)  F43.10   4. Generalized anxiety disorder  F41.1   5. Insomnia, unspecified type  G47.00     Receiving Psychotherapy: Yes   Adria Devon  RECOMMENDATIONS:  PDMP reviewed. I provided 40 minutes of face-to-face time during this encounter, including time spent before and after the visit in records review and charting. Discussed the worsening anxiety and depression.  Recommend increasing the Zoloft which will help both those problems.  She knows to go to [facility] or [facility] emergency room if she does become suicidal. Continue Seroquel XR 300 mg, 1 p.o. nightly. Continue Xanax 0.5 mg 1 p.o. 3 times daily as needed. Increase Zoloft to 100 mg, 1.5 pills daily. Continue counseling. Return in 4-6 weeks.  [name], PA-C

## 2021-03-10 ENCOUNTER — Ambulatory Visit: Payer: Federal, State, Local not specified - PPO | Admitting: Physician Assistant

## 2021-05-04 ENCOUNTER — Ambulatory Visit: Payer: Self-pay | Admitting: Physician Assistant

## 2021-11-20 ENCOUNTER — Emergency Department (HOSPITAL_COMMUNITY): Payer: Federal, State, Local not specified - PPO

## 2021-11-20 ENCOUNTER — Emergency Department (HOSPITAL_COMMUNITY)
Admission: EM | Admit: 2021-11-20 | Discharge: 2021-11-20 | Discharge status: Against Medical Advice | Payer: Federal, State, Local not specified - PPO | Attending: Emergency Medicine | Admitting: Emergency Medicine

## 2021-11-20 ENCOUNTER — Encounter (HOSPITAL_COMMUNITY): Payer: Self-pay | Admitting: Emergency Medicine

## 2021-11-20 DIAGNOSIS — R6884 Jaw pain: Secondary | ICD-10-CM | POA: Insufficient documentation

## 2021-11-20 DIAGNOSIS — R519 Headache, unspecified: Secondary | ICD-10-CM | POA: Diagnosis present

## 2021-11-20 DIAGNOSIS — R200 Anesthesia of skin: Secondary | ICD-10-CM | POA: Insufficient documentation

## 2021-11-20 DIAGNOSIS — M25519 Pain in unspecified shoulder: Secondary | ICD-10-CM | POA: Insufficient documentation

## 2021-11-20 DIAGNOSIS — Z5321 Procedure and treatment not carried out due to patient leaving prior to being seen by health care provider: Secondary | ICD-10-CM | POA: Diagnosis not present

## 2021-11-20 DIAGNOSIS — R110 Nausea: Secondary | ICD-10-CM | POA: Diagnosis not present

## 2021-11-20 DIAGNOSIS — M549 Dorsalgia, unspecified: Secondary | ICD-10-CM | POA: Diagnosis not present

## 2021-11-20 HISTORY — DX: Anxiety disorder, unspecified: F41.9

## 2021-11-20 HISTORY — DX: Depression, unspecified: F32.A

## 2021-11-20 LAB — COMPREHENSIVE METABOLIC PANEL
ALT: 22 U/L (ref 0–44)
AST: 21 U/L (ref 15–41)
Albumin: 3.9 g/dL (ref 3.5–5.0)
Alkaline Phosphatase: 71 U/L (ref 38–126)
Anion gap: 7 (ref 5–15)
BUN: 12 mg/dL (ref 6–20)
CO2: 24 mmol/L (ref 22–32)
Calcium: 9 mg/dL (ref 8.9–10.3)
Chloride: 106 mmol/L (ref 98–111)
Creatinine, Ser: 0.74 mg/dL (ref 0.44–1.00)
GFR, Estimated: >60 mL/min (ref >60)
Glucose, Bld: 142 mg/dL — ABNORMAL HIGH (ref 70–99)
Potassium: 3.6 mmol/L (ref 3.5–5.1)
Sodium: 137 mmol/L (ref 135–145)
Total Bilirubin: 0.5 mg/dL (ref 0.3–1.2)
Total Protein: 7.8 g/dL (ref 6.5–8.1)

## 2021-11-20 LAB — CBC WITH DIFFERENTIAL/PLATELET
Abs Immature Granulocytes: 0.04 10*3/uL (ref 0.00–0.07)
Basophils Absolute: 0 10*3/uL (ref 0.0–0.1)
Basophils Relative: 0 %
Eosinophils Absolute: 0.1 10*3/uL (ref 0.0–0.5)
Eosinophils Relative: 1 %
HCT: 38.8 % (ref 36.0–46.0)
Hemoglobin: 12.7 g/dL (ref 12.0–15.0)
Immature Granulocytes: 0 %
Lymphocytes Relative: 25 %
Lymphs Abs: 2.4 10*3/uL (ref 0.7–4.0)
MCH: 31.5 pg (ref 26.0–34.0)
MCHC: 32.7 g/dL (ref 30.0–36.0)
MCV: 96.3 fL (ref 80.0–100.0)
Monocytes Absolute: 0.5 10*3/uL (ref 0.1–1.0)
Monocytes Relative: 5 %
Neutro Abs: 6.6 10*3/uL (ref 1.7–7.7)
Neutrophils Relative %: 69 %
Platelets: 374 10*3/uL (ref 150–400)
RBC: 4.03 MIL/uL (ref 3.87–5.11)
RDW: 12.6 % (ref 11.5–15.5)
WBC: 9.6 10*3/uL (ref 4.0–10.5)
nRBC: 0 % (ref 0.0–0.2)

## 2021-11-20 LAB — TROPONIN I (HIGH SENSITIVITY): Troponin I (High Sensitivity): 3 ng/L (ref <18)

## 2021-11-20 NOTE — ED Triage Notes (Signed)
Patient complains of an episode yesterday of sudden onset of headache, nausea, left arm numbness, jaw pain, shoulder pain, and back pain. Patient called PCP and was told to go to ED for evaluation. Patient states no improvement in headache from yesterday.

## 2021-11-20 NOTE — ED Provider Triage Note (Signed)
Emergency Medicine Provider Triage Evaluation Note  Jordan Carrillo , a 61 y.o. female  was evaluated in triage.  Pt complains of headache, numbness, and chest pain.  Patient states that yesterday at approximately 1300 she had a sudden onset of headache, nausea, left arm numbness, and chest pain.  Patient states that she called her PCP and was told to come to the emergency department however patient waited due to believing her symptoms resolved.  Patient states that her headache, chest pain, and left arm numbness have not resolved.  Patient describes chest pain as a squeezing and burning sensation.  Pain is located to midsternal and is worse with eating.  Review of Systems  Positive: Chest pain, headache, left arm numbness, nausea, Negative: Visual disturbance, weakness, dysarthria, facial asymmetry, syncope  Physical Exam  BP 119/89    Pulse (!) 108    Temp 98.5 F (36.9 C) (Oral)    Resp 18    LMP 01/18/2016 Comment: irregular periods   SpO2 98%  Gen:   Awake, no distress, tearful affect Resp:  Normal effort, lungs clear to auscultation bilaterally MSK:   Moves extremities without difficulty Other:  +2 radial pulse bilaterally.  Negative pronator drift.  No dysarthria or facial asymmetry.  Patient moves all limbs equally without difficulty.  Medical Decision Making  Medically screening exam initiated at 12:45 PM.  Appropriate orders placed.  Jordan Carrillo was informed that the remainder of the evaluation will be completed by another provider, this initial triage assessment does not replace that evaluation, and the importance of remaining in the ED until their evaluation is complete.     Jordan Carrillo, [place] 11/20/21 1247

## 2022-01-22 IMAGING — CT CT HEAD W/O CM
4 series · 17 of 47 positions shown, 19 images · non-contrast
Comparison: MR brain done on 12/26/2018

CLINICAL DATA: Neurological deficit

EXAM:
CT HEAD WITHOUT CONTRAST
TECHNIQUE: Contiguous axial images were obtained from the base of the skull
through the vertex without intravenous contrast.

[Series 3: head wo · axial · 0.44mm/px · z∈[+1297,+1412]mm · 7 of 31 slices shown, 9 images]
[im 4/31  brain]
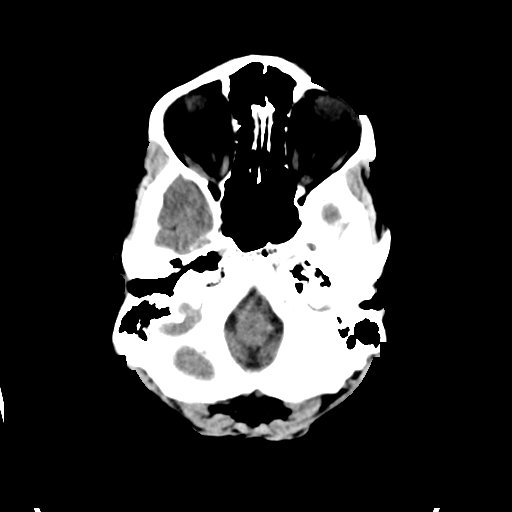
[im 4/31  bone]
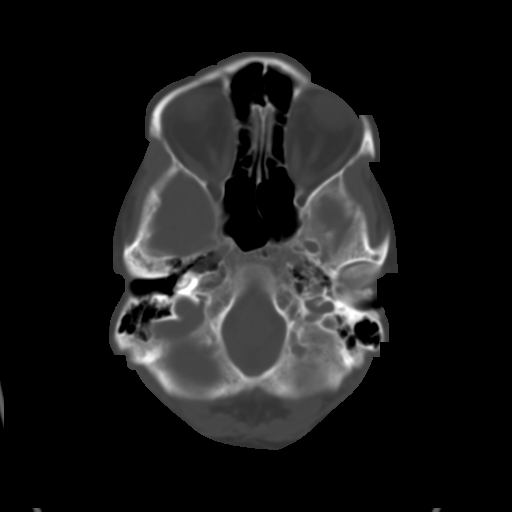
[im 8/31  brain]
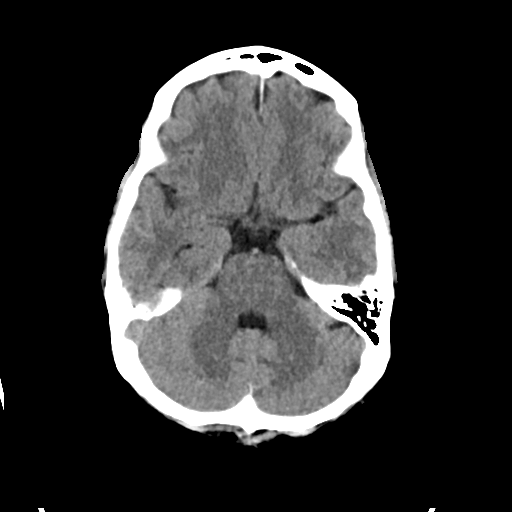
[im 12/31  brain]
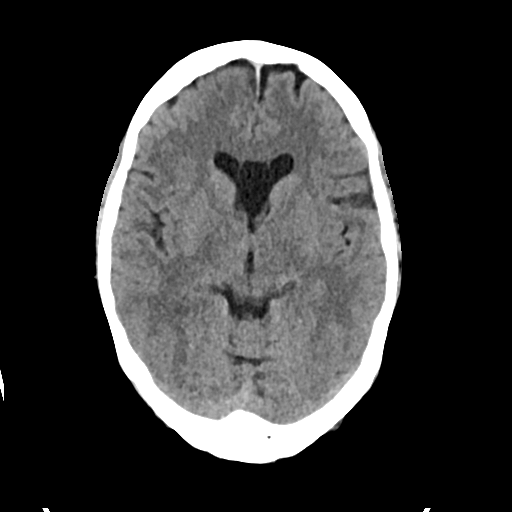
[im 16/31  brain]
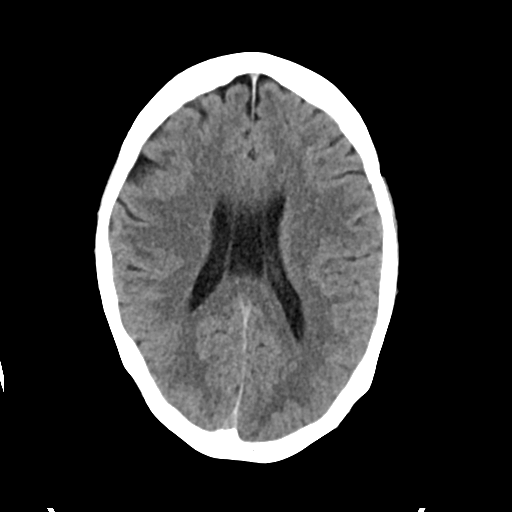
[im 19/31  brain]
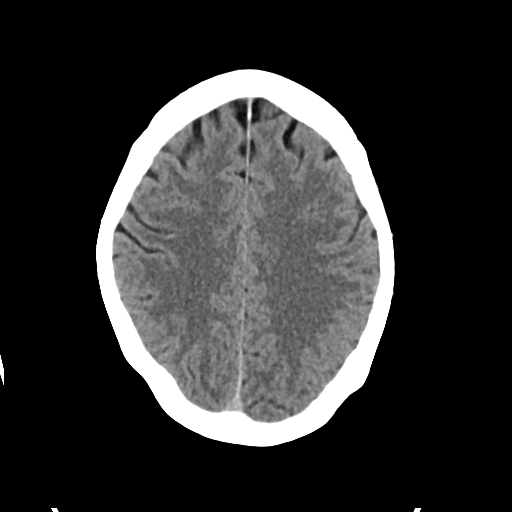
[im 19/31  bone]
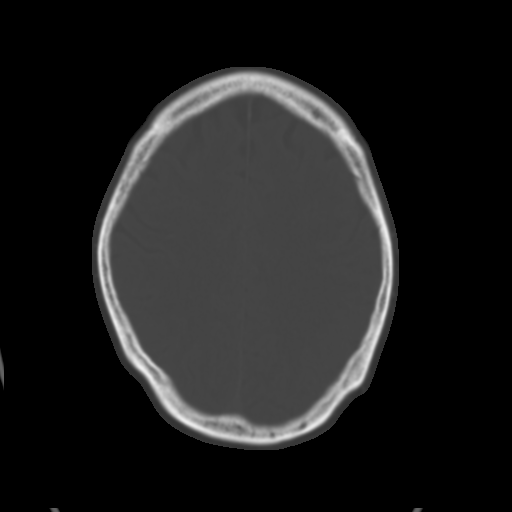
[im 23/31  brain]
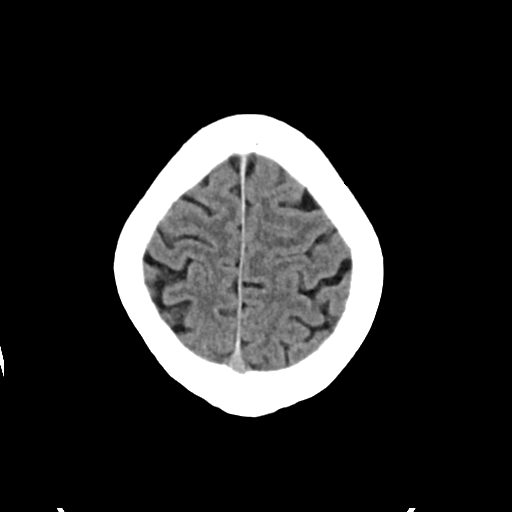
[im 27/31  brain]
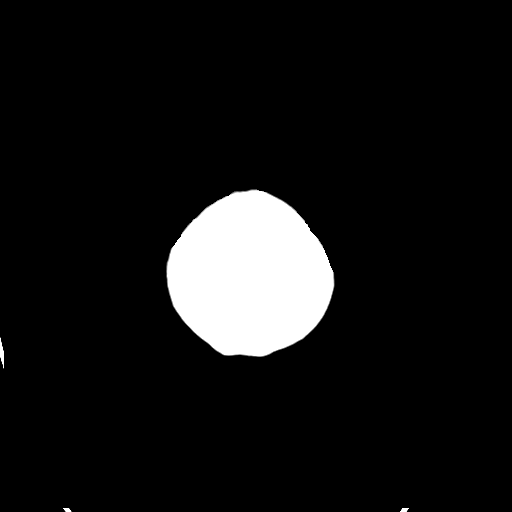

[Series 4: head bone · axial · 0.44mm/px · z∈[+1296,+1350]mm · 4 of 77 slices shown]
[im 8/77  bone]
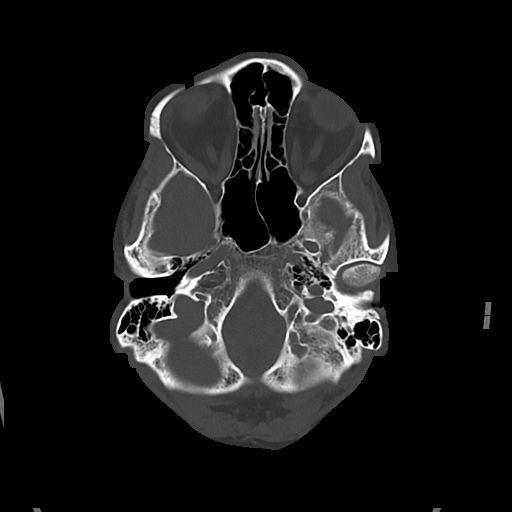
[im 16/77  bone]
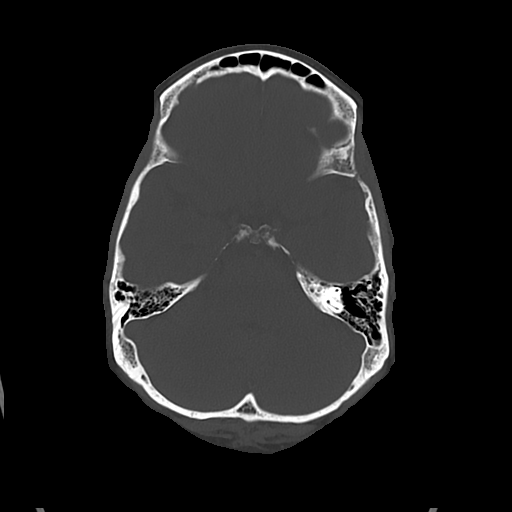
[im 23/77  bone]
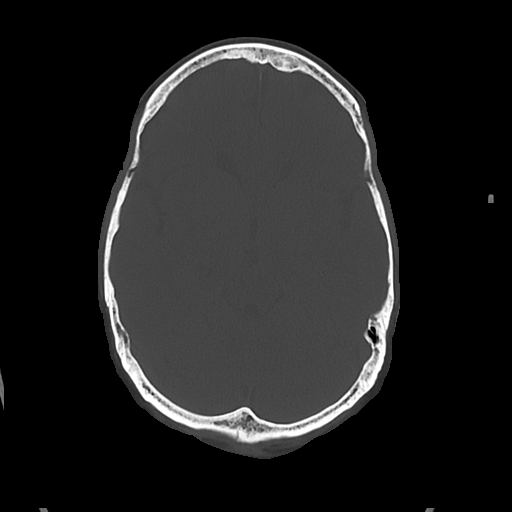
[im 35/77  bone]
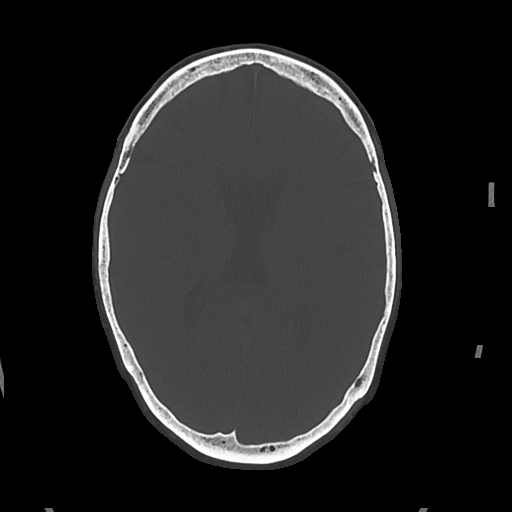

[Series 5: cor soft · coronal · 0.34mm/px · 3 of 72 slices shown]
[im 24/72  brain]
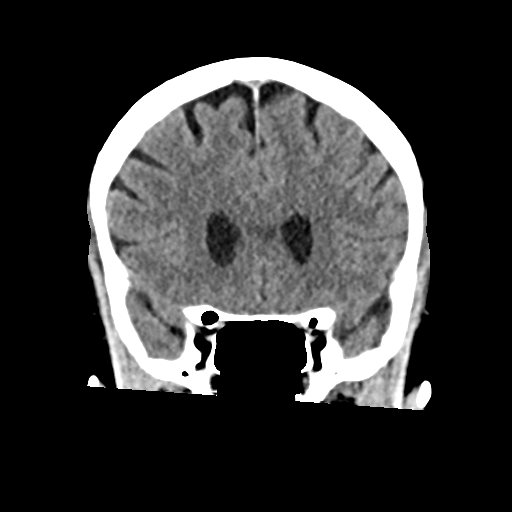
[im 32/72  brain]
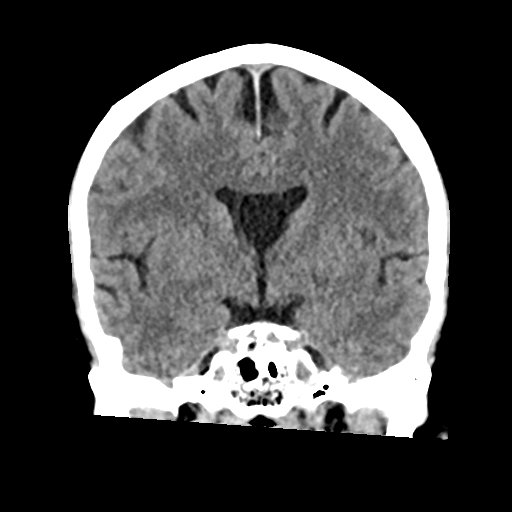
[im 40/72  brain]
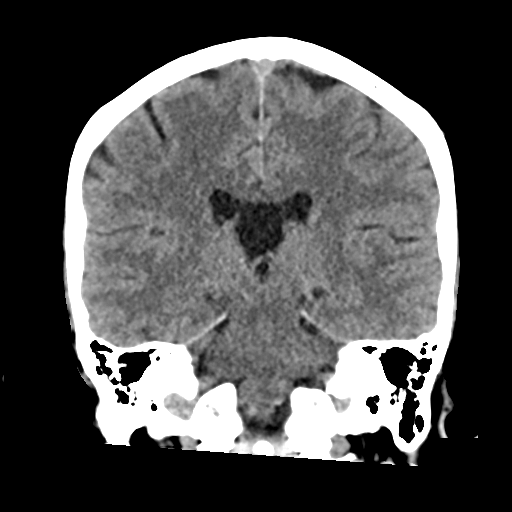

[Series 6: sag soft · sagittal · 0.33mm/px · 3 of 58 slices shown]
[im 20/58  brain]
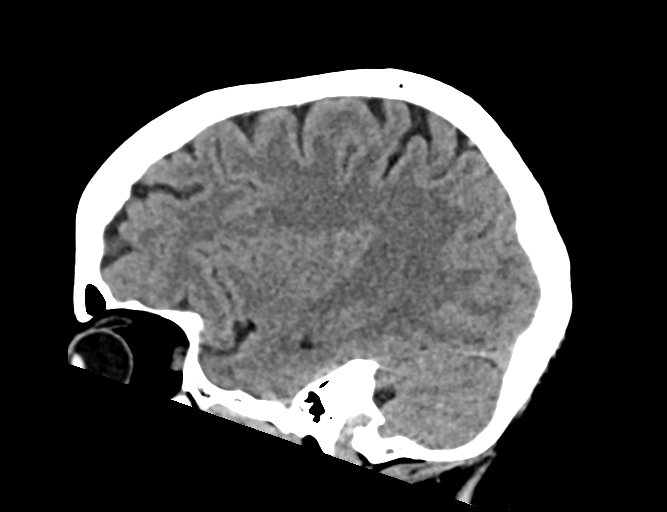
[im 29/58  brain]
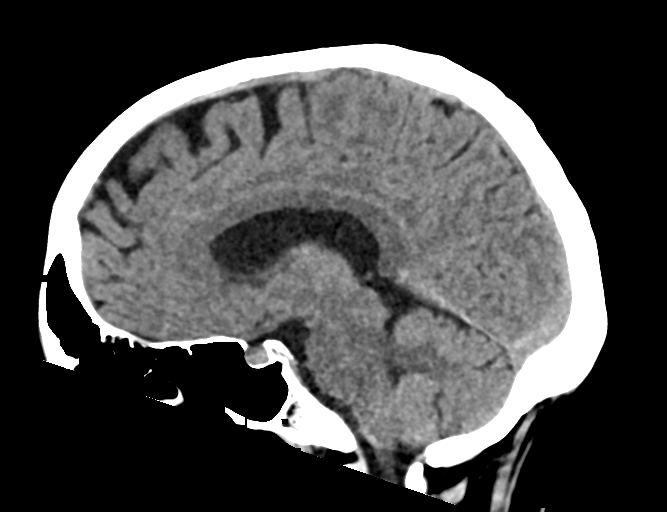
[im 39/58  brain]
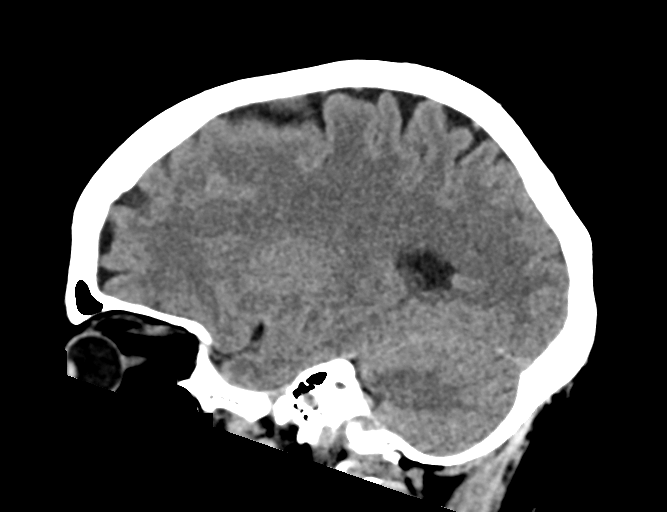

[17 of 47 positions shown; findings below may reference images not displayed]

FINDINGS: Brain: No acute intracranial findings are seen. There are no signs
of bleeding. Cortical sulci are prominent. Cavum septum pellucidum
and cavum septum vergae are seen. There is no shift of midline
structures.

Vascular: Unremarkable.

Skull: Unremarkable.

Sinuses/Orbits: Unremarkable.

Other: None
IMPRESSION: No acute intracranial findings are seen in noncontrast CT brain.
Atrophy.

## 2022-01-22 IMAGING — CR DG CHEST 2V
2 series · 2 of 2 positions shown · non-contrast
Comparison: July 28, 2013.

CLINICAL DATA: Chest pain.

EXAM:
CHEST - 2 VIEW

[chest pa]
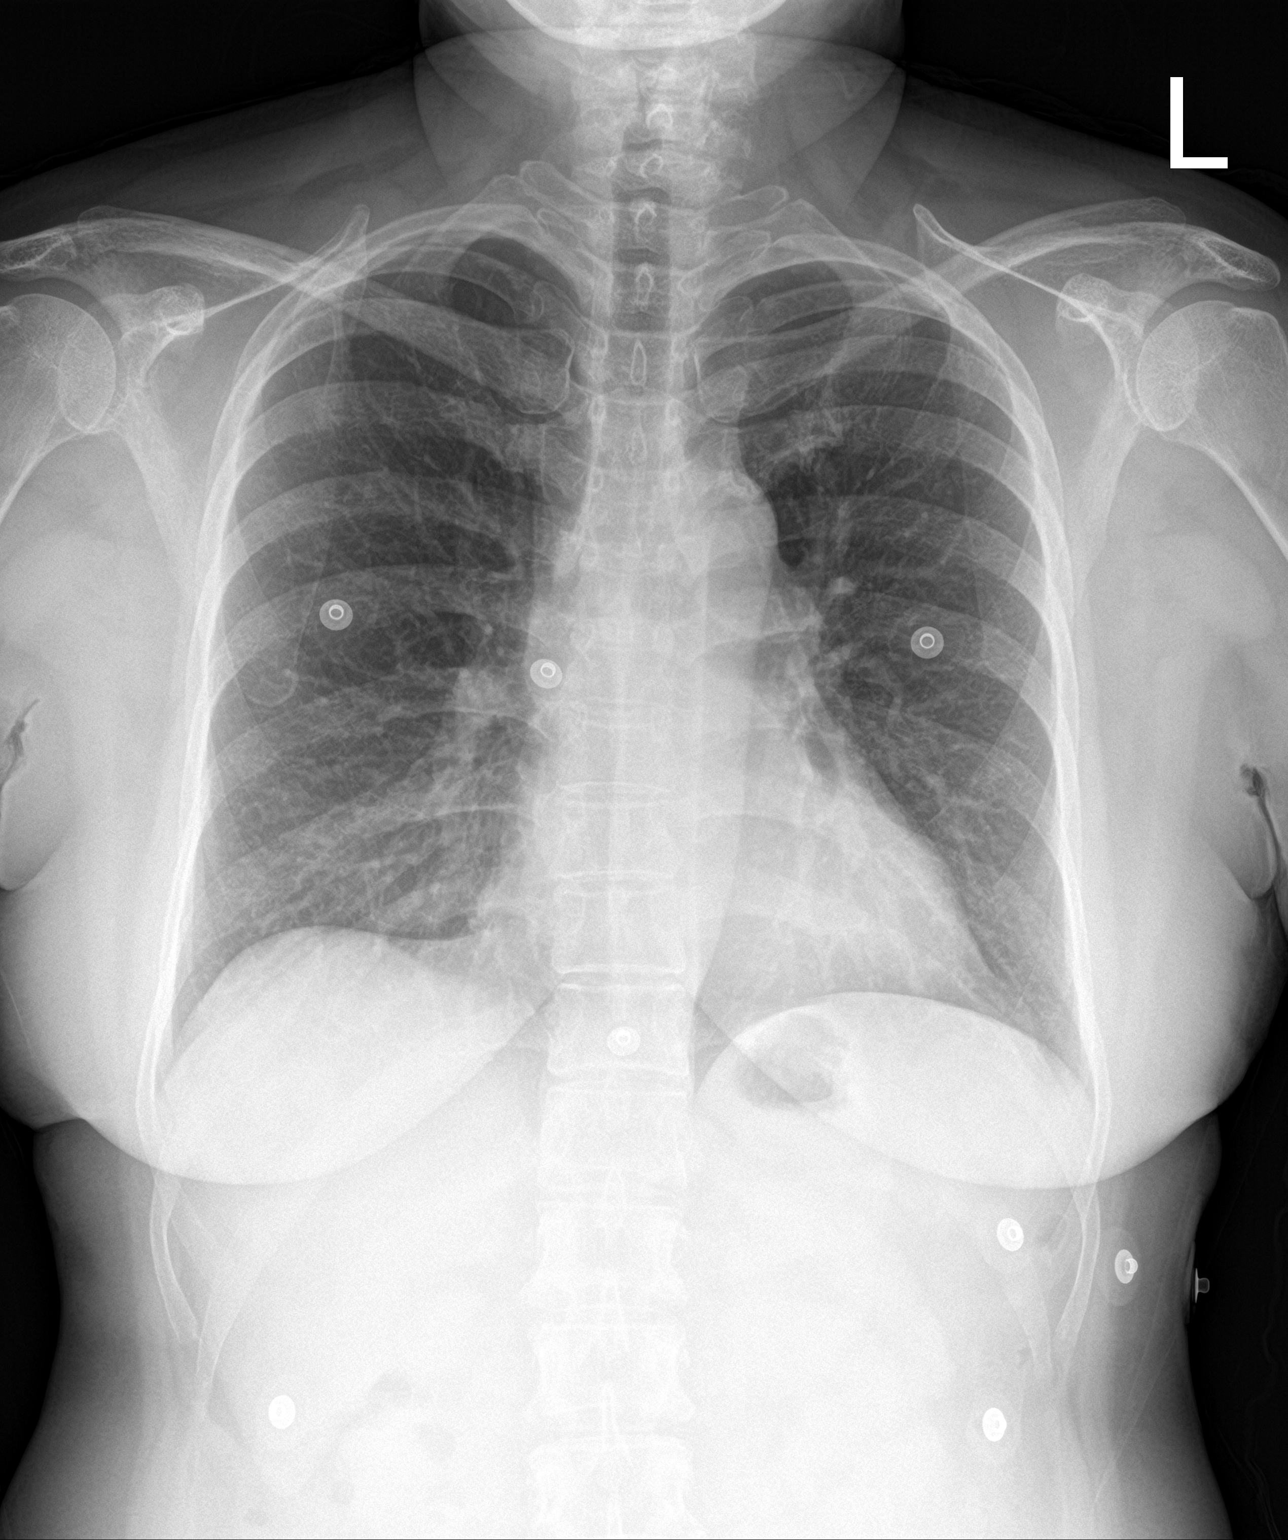

[chest lat]
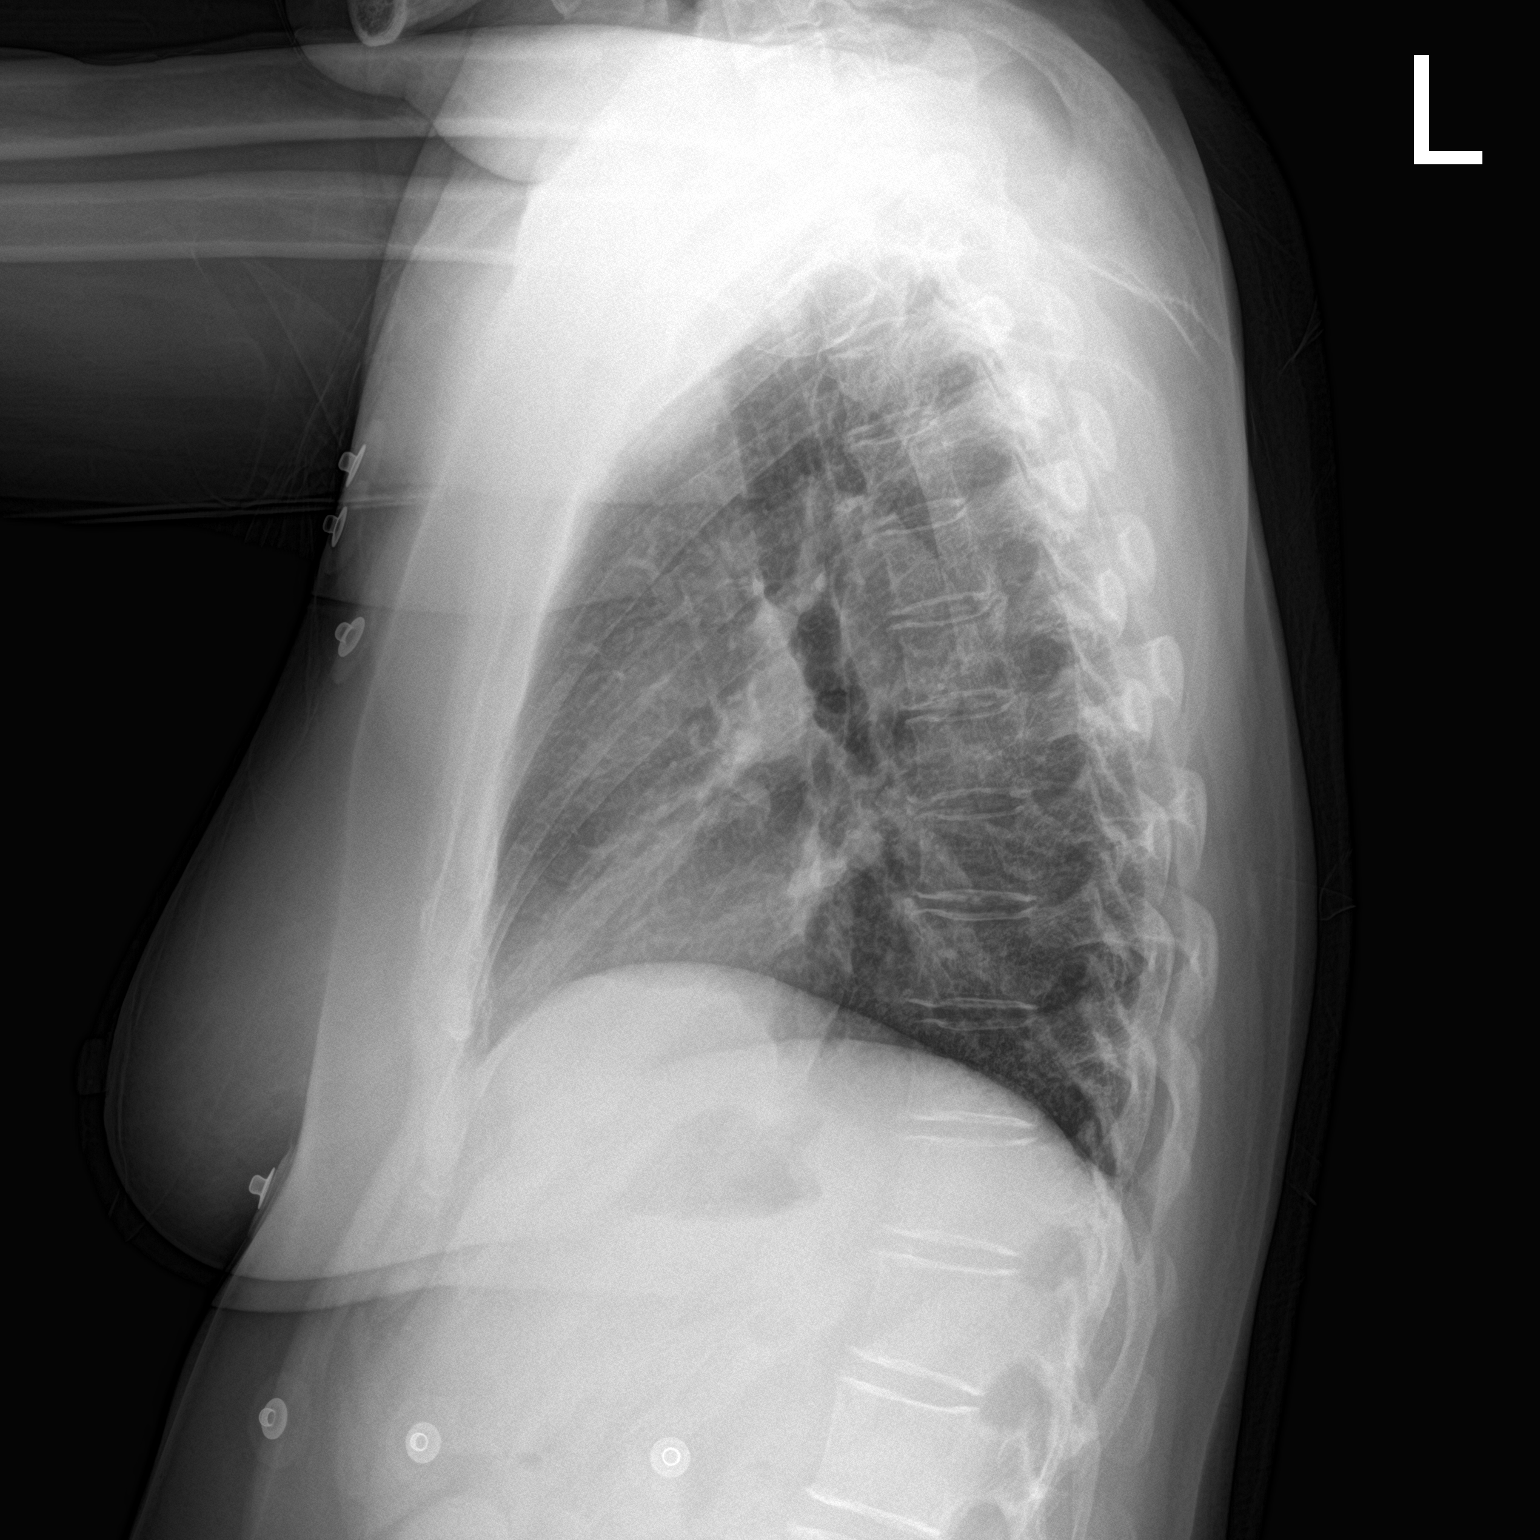

[2 of 2 positions shown; findings below may reference images not displayed]

FINDINGS: The heart size and mediastinal contours are within normal limits.
Both lungs are clear. The visualized skeletal structures are
unremarkable.
IMPRESSION: No active cardiopulmonary disease.

## 2022-02-09 ENCOUNTER — Ambulatory Visit: Payer: Federal, State, Local not specified - PPO | Admitting: Physician Assistant

## 2022-06-23 ENCOUNTER — Emergency Department (HOSPITAL_BASED_OUTPATIENT_CLINIC_OR_DEPARTMENT_OTHER)
Admission: EM | Admit: 2022-06-23 | Discharge: 2022-06-23 | Disposition: A | Discharge status: Home / Self Care | Payer: Medicare Other | Attending: Emergency Medicine | Admitting: Emergency Medicine

## 2022-06-23 ENCOUNTER — Encounter (HOSPITAL_BASED_OUTPATIENT_CLINIC_OR_DEPARTMENT_OTHER): Payer: Self-pay | Admitting: Emergency Medicine

## 2022-06-23 ENCOUNTER — Emergency Department (HOSPITAL_BASED_OUTPATIENT_CLINIC_OR_DEPARTMENT_OTHER): Payer: Medicare Other

## 2022-06-23 ENCOUNTER — Other Ambulatory Visit: Payer: Self-pay

## 2022-06-23 DIAGNOSIS — R6884 Jaw pain: Secondary | ICD-10-CM | POA: Diagnosis not present

## 2022-06-23 DIAGNOSIS — E860 Dehydration: Secondary | ICD-10-CM | POA: Insufficient documentation

## 2022-06-23 DIAGNOSIS — R55 Syncope and collapse: Secondary | ICD-10-CM | POA: Insufficient documentation

## 2022-06-23 DIAGNOSIS — Z91040 Latex allergy status: Secondary | ICD-10-CM | POA: Insufficient documentation

## 2022-06-23 DIAGNOSIS — R079 Chest pain, unspecified: Secondary | ICD-10-CM | POA: Diagnosis present

## 2022-06-23 DIAGNOSIS — R42 Dizziness and giddiness: Secondary | ICD-10-CM | POA: Insufficient documentation

## 2022-06-23 DIAGNOSIS — M79602 Pain in left arm: Secondary | ICD-10-CM | POA: Diagnosis not present

## 2022-06-23 HISTORY — DX: Syncope and collapse: R55

## 2022-06-23 LAB — BASIC METABOLIC PANEL
Anion gap: 7 (ref 5–15)
BUN: 14 mg/dL (ref 6–20)
CO2: 25 mmol/L (ref 22–32)
Calcium: 8.9 mg/dL (ref 8.9–10.3)
Chloride: 105 mmol/L (ref 98–111)
Creatinine, Ser: 0.83 mg/dL (ref 0.44–1.00)
GFR, Estimated: >60 mL/min (ref >60)
Glucose, Bld: 100 mg/dL — ABNORMAL HIGH (ref 70–99)
Potassium: 3.9 mmol/L (ref 3.5–5.1)
Sodium: 137 mmol/L (ref 135–145)

## 2022-06-23 LAB — CBC WITH DIFFERENTIAL/PLATELET
Abs Immature Granulocytes: 0.04 10*3/uL (ref 0.00–0.07)
Basophils Absolute: 0 10*3/uL (ref 0.0–0.1)
Basophils Relative: 0 %
Eosinophils Absolute: 0.1 10*3/uL (ref 0.0–0.5)
Eosinophils Relative: 1 %
HCT: 37.4 % (ref 36.0–46.0)
Hemoglobin: 12.6 g/dL (ref 12.0–15.0)
Immature Granulocytes: 0 %
Lymphocytes Relative: 24 %
Lymphs Abs: 2.6 10*3/uL (ref 0.7–4.0)
MCH: 31.9 pg (ref 26.0–34.0)
MCHC: 33.7 g/dL (ref 30.0–36.0)
MCV: 94.7 fL (ref 80.0–100.0)
Monocytes Absolute: 0.6 10*3/uL (ref 0.1–1.0)
Monocytes Relative: 6 %
Neutro Abs: 7.4 10*3/uL (ref 1.7–7.7)
Neutrophils Relative %: 69 %
Platelets: 333 10*3/uL (ref 150–400)
RBC: 3.95 MIL/uL (ref 3.87–5.11)
RDW: 12.3 % (ref 11.5–15.5)
WBC: 10.8 10*3/uL — ABNORMAL HIGH (ref 4.0–10.5)
nRBC: 0 % (ref 0.0–0.2)

## 2022-06-23 LAB — TROPONIN I (HIGH SENSITIVITY): Troponin I (High Sensitivity): <2 ng/L (ref <18)

## 2022-06-23 LAB — MAGNESIUM: Magnesium: 2.2 mg/dL (ref 1.7–2.4)

## 2022-06-23 MED ORDER — LACTATED RINGERS IV BOLUS
1000.0000 mL | Freq: Once | INTRAVENOUS | Status: AC
  Administered 2022-06-23: 1000 mL via INTRAVENOUS

## 2022-06-23 NOTE — Discharge Instructions (Signed)
All the test on your heart today are normal.  Your symptoms are probably from lack of food and being dehydrated.  Also you can stop the antibiotic.  Follow up with your dentist for a mouth guard or try one over the counter.

## 2022-06-23 NOTE — ED Provider Notes (Signed)
[facility]ERGENCY DEPARTMENT Provider Note   CSN: [id number] Arrival date & time: 06/23/22  1229     History  Chief Complaint  Patient presents with   Chest Pain    Jordan Carrillo is a 61 y.o. female.  Patient is a 61 year old female with multiple medical problems including Crohn's disease, recurrent syncope, hyperlipidemia, schizoaffective disorder, Graves' disease on methimazole, depression who is presenting today with multiple complaints.  Patient reports intermittently now she has had terrible pain in her left jaw and ear that radiates into her shoulder.  It sometimes also goes on to the right side but seems to be the worst on the left side.  She followed up with her doctor a few days ago and was given a prescription for azithromycin and told she had fluid in her ear.  Patient denies any drainage from the ear, fever, cough or congestion.  She does report that she grinds her teeth and clenches at night but does not wear any had a mouthguard.  She reports that the reason she came today was because she was walking her dog and she got a squeezing type pain in the left side of her chest and in her arm.  It started around 7 AM this morning and resolved around 10 AM when she took an antacid.  She denies having any pain at this time but reports during those 3 hours it was waxing and waning.  She reports her head just feels foggy and she feels a bit lightheaded at this time.  She does not have any shortness of breath, nausea or vomiting.  She denies any abdominal pain.  However patient has a friend with her who reports that she is very concerned because she has not been eating much.  The patient reports that anytime she has food it makes her feel bad she will have cramps or start itching and it can take up for a week to get her symptoms controlled again.  She has a very restricted diet because of this and reports she is just tired of eating the same thing.  She does not drink alcohol or  use tobacco products.  She has no known heart disease.  Other than the azithromycin she is started no new medications recently.  The history is provided by the patient and medical records.  Chest Pain      Home Medications Prior to Admission medications   Medication Sig Start Date End Date Taking? Authorizing Provider  Acetaminophen-Codeine 300-30 MG tablet TAKE 1 TO 2 TABLETS BY MOUTH TWICE DAILY TO THREE TIMES DAILY WHEN NECESSARY 10/28/20   [provider]  ALPRAZolam Duanne Moron) 0.5 MG tablet Take 1 tablet (0.5 mg total) by mouth 3 (three) times daily as needed for anxiety. 01/26/21   Addison Lank, PA-C  meclizine (ANTIVERT) 25 MG tablet Take 1 tablet (25 mg total) by mouth 2 (two) times daily as needed for dizziness. Patient not taking: No sig reported 03/28/20   Elby Showers, MD  mesalamine (ROWASA) 4 g enema Place rectally. 11/02/20   [provider]  methimazole (TAPAZOLE) 10 MG tablet TAKE 2 TABLETS(20 MG) BY MOUTH DAILY 05/03/20   Elby Showers, MD  pantoprazole (PROTONIX) 40 MG tablet Take by mouth. 02/17/20   [provider]  QUEtiapine (SEROQUEL XR) 300 MG 24 hr tablet Take 1 tablet (300 mg total) by mouth at bedtime. 01/26/21   [name] T, PA-C  rosuvastatin (CRESTOR) 10 MG tablet Take 10 mg by  mouth at bedtime. 09/27/20   [provider]  sertraline (ZOLOFT) 100 MG tablet Take 1.5 tablets (150 mg total) by mouth daily. 01/26/21   Addison Lank, PA-C      Allergies    Prednisone and Latex    Review of Systems   Review of Systems  Cardiovascular:  Positive for chest pain.    Physical Exam Updated Vital Signs BP 121/77   Pulse 73   Temp 98.2 F (36.8 C) (Oral)   Resp (!) 22   Ht 5' 4"  (1.626 m)   Wt 65.8 kg   LMP 01/18/2016 Comment: irregular periods  SpO2 98%   BMI 24.89 kg/m  Physical Exam Vitals and nursing note reviewed.  Constitutional:      General: She is not in acute distress.    Appearance: She is  well-developed.  HENT:     Head: Normocephalic and atraumatic.     Comments: Pain with palpation over the left TMJ.  No palpable lymph nodes    Right Ear: Tympanic membrane normal.     Ears:   Eyes:     Pupils: Pupils are equal, round, and reactive to light.  Cardiovascular:     Rate and Rhythm: Normal rate and regular rhythm.     Heart sounds: Normal heart sounds. No murmur heard.    No friction rub.  Pulmonary:     Effort: Pulmonary effort is normal.     Breath sounds: Normal breath sounds. No wheezing or rales.  Abdominal:     General: Bowel sounds are normal. There is no distension.     Palpations: Abdomen is soft.     Tenderness: There is no abdominal tenderness. There is no guarding or rebound.  Musculoskeletal:        General: No tenderness. Normal range of motion.     Comments: No edema  Skin:    General: Skin is warm and dry.     Findings: No rash.  Neurological:     Mental Status: She is alert and oriented to person, place, and time.     Cranial Nerves: No cranial nerve deficit.  Psychiatric:        Behavior: Behavior normal.     Comments: Focused and directed speech.  No hallucinations     ED Results / Procedures / Treatments   Labs (all labs ordered are listed, but only abnormal results are displayed) Labs Reviewed  CBC WITH DIFFERENTIAL/PLATELET - Abnormal; Notable for the following components:      Result Value   WBC 10.8 (*)    All other components within normal limits  BASIC METABOLIC PANEL - Abnormal; Notable for the following components:   Glucose, Bld 100 (*)    All other components within normal limits  MAGNESIUM  TROPONIN I (HIGH SENSITIVITY)    EKG EKG Interpretation  Date/Time:  Saturday June 23 2022 12:41:34 EDT Ventricular Rate:  75 PR Interval:  167 QRS Duration: 83 QT Interval:  376 QTC Calculation: 420 R Axis:   70 Text Interpretation: Sinus rhythm Normal ECG Confirmed by Blanchie Dessert ([id number]) on 06/23/2022 12:50:53  PM  Radiology DG Chest Port 1 View  Result Date: 06/23/2022 CLINICAL DATA:  Left chest pain EXAM: PORTABLE CHEST 1 VIEW COMPARISON:  11/20/2021 FINDINGS: The heart size and mediastinal contours are within normal limits. Both lungs are clear. The visualized skeletal structures are unremarkable. IMPRESSION: No active disease. Electronically Signed   By: Elmer Picker M.D.   On: 06/23/2022 13:18  Procedures Procedures    Medications Ordered in ED Medications  lactated ringers bolus 1,000 mL (1,000 mLs Intravenous New Bag/Given 06/23/22 1317)    ED Course/ Medical Decision Making/ A&P                           Medical Decision Making Amount and/or Complexity of Data Reviewed Independent Historian: friend External Data Reviewed: notes. Labs: ordered. Decision-making details documented in ED Course. Radiology: ordered and independent interpretation performed. Decision-making details documented in ED Course. ECG/medicine tests: ordered and independent interpretation performed. Decision-making details documented in ED Course.   Pt with multiple medical problems and comorbidities and presenting today with a complaint that caries a high risk for morbidity and mortality.  Presenting today with several complaints.  Initial complaint is of chest discomfort that started when she was walking her dog this morning as well as associated dizziness.  Patient has been suffering from syncope and dizziness for quite some time.  She has had MRIs and EKGs done but is still not sure what is causing this.  Patient is currently not having any pain.  Patient has low risk Wells criteria and low suspicion at this time for PE.  Patient is not having any infectious symptoms and low suspicion for pneumonia.  I independently interpreted the patient's EKG today which is normal.  There is no evidence of dysrhythmia or ST abnormalities.  Low suspicion for ACS, pericarditis or myocarditis at this time.  Patient has a  heart score of 2 making her low risk.  Patient does have a history of Crohn's disease and also recently started on azithromycin for a left-sided ear effusion.  Patient has no evidence of otitis media today and with her sensitive stomach, poor oral intake concerned that that could be adding into her symptoms.  Encourage patient to discontinue the antibiotic at this time.  She was also given IV fluids due to her lack of oral intake.  Will ensure no evidence of electrolyte abnormalities or anemia.  Feel that patient may be having symptoms of TMJ with referred pain to the ear as she does have tenderness with palpation in this area and reports grinding her teeth and clenching at night.  Encouraged her to follow-up with a dentist or try an over-the-counter mouthguard. I independently interpreted patient's labs today and CBC, BMP, troponin and magnesium without acute findings. I have independently visualized and interpreted pt's images today.  CXR wnl.          Final Clinical Impression(s) / ED Diagnoses Final diagnoses:  Nonspecific chest pain  Dehydration    Rx / DC Orders ED Discharge Orders     None         Blanchie Dessert, MD 06/23/22 1411

## 2022-06-23 NOTE — ED Triage Notes (Signed)
PT c/o LUA and LT chest pain since last pm; describes as squeezing; reports dizziness today; denies NV, diaphoresis

## 2022-07-25 ENCOUNTER — Encounter (HOSPITAL_BASED_OUTPATIENT_CLINIC_OR_DEPARTMENT_OTHER): Payer: Self-pay

## 2022-07-25 ENCOUNTER — Other Ambulatory Visit: Payer: Self-pay

## 2022-07-25 ENCOUNTER — Other Ambulatory Visit (HOSPITAL_BASED_OUTPATIENT_CLINIC_OR_DEPARTMENT_OTHER): Payer: Self-pay

## 2022-07-25 ENCOUNTER — Emergency Department (HOSPITAL_BASED_OUTPATIENT_CLINIC_OR_DEPARTMENT_OTHER): Payer: Medicare Other

## 2022-07-25 ENCOUNTER — Emergency Department (HOSPITAL_BASED_OUTPATIENT_CLINIC_OR_DEPARTMENT_OTHER)
Admission: EM | Admit: 2022-07-25 | Discharge: 2022-07-25 | Disposition: A | Discharge status: Home / Self Care | Payer: Medicare Other | Attending: Emergency Medicine | Admitting: Emergency Medicine

## 2022-07-25 DIAGNOSIS — Z91040 Latex allergy status: Secondary | ICD-10-CM | POA: Insufficient documentation

## 2022-07-25 DIAGNOSIS — R059 Cough, unspecified: Secondary | ICD-10-CM | POA: Diagnosis present

## 2022-07-25 DIAGNOSIS — J069 Acute upper respiratory infection, unspecified: Secondary | ICD-10-CM | POA: Insufficient documentation

## 2022-07-25 MED ORDER — AZITHROMYCIN 250 MG PO TABS
ORAL_TABLET | ORAL | 0 refills | Status: AC
  Filled 2022-07-25: qty 6, 8d supply, fill #0

## 2022-07-25 MED ORDER — AZITHROMYCIN 250 MG PO TABS: ORAL_TABLET | ORAL | 0 refills | Status: DC

## 2022-07-25 MED ORDER — DEXAMETHASONE 4 MG PO TABS: 10.0000 mg | ORAL_TABLET | Freq: Once | ORAL | Status: DC

## 2022-07-25 NOTE — Discharge Instructions (Addendum)
Follow-up your COVID test in your MyChart, this should be available later today.  I have prescribed you antibiotics to help with cough.  You have been treated with a long-acting steroid to help as well.

## 2022-07-25 NOTE — ED Notes (Signed)
Pt refused covid swab. Tested yesterday.

## 2022-07-25 NOTE — ED Provider Notes (Signed)
[facility] EMERGENCY DEPARTMENT Provider Note   CSN: [id number] Arrival date & time: 07/25/22  1000     History  Chief Complaint  Patient presents with   Cough    Jordan Carrillo is a 61 y.o. female.  The history is provided by the patient.  Cough Cough characteristics:  Non-productive Sputum characteristics:  Nondescript Severity:  Mild Onset quality:  Gradual Duration:  2 weeks Timing:  Intermittent Progression:  Waxing and waning Chronicity:  New Smoker: no   Context: upper respiratory infection   Relieved by:  Nothing Worsened by:  Nothing Associated symptoms: sinus congestion   Associated symptoms: no chest pain, no chills, no diaphoresis, no ear fullness, no ear pain, no eye discharge, no fever, no headaches, no myalgias, no rash, no rhinorrhea, no shortness of breath, no sore throat, no weight loss and no wheezing        Home Medications Prior to Admission medications   Medication Sig Start Date End Date Taking? Authorizing Provider  azithromycin (ZITHROMAX Z-PAK) 250 MG tablet 2 tablets day one, 1 table next 4 days. 07/25/22  Yes Jordan Hallinan, DO  Acetaminophen-Codeine 300-30 MG tablet TAKE 1 TO 2 TABLETS BY MOUTH TWICE DAILY TO THREE TIMES DAILY WHEN NECESSARY 10/28/20   [provider]  ALPRAZolam Duanne Moron) 0.5 MG tablet Take 1 tablet (0.5 mg total) by mouth 3 (three) times daily as needed for anxiety. 01/26/21   Jordan Lank, PA-C  meclizine (ANTIVERT) 25 MG tablet Take 1 tablet (25 mg total) by mouth 2 (two) times daily as needed for dizziness. Patient not taking: No sig reported 03/28/20   Jordan Showers, MD  mesalamine (ROWASA) 4 g enema Place rectally. 11/02/20   [provider]  methimazole (TAPAZOLE) 10 MG tablet TAKE 2 TABLETS(20 MG) BY MOUTH DAILY 05/03/20   Jordan Showers, MD  pantoprazole (PROTONIX) 40 MG tablet Take by mouth. 02/17/20   [provider]  QUEtiapine (SEROQUEL XR) 300 MG 24 hr tablet Take 1 tablet  (300 mg total) by mouth at bedtime. 01/26/21   Jordan Moat T, PA-C  rosuvastatin (CRESTOR) 10 MG tablet Take 10 mg by mouth at bedtime. 09/27/20   [provider]  sertraline (ZOLOFT) 100 MG tablet Take 1.5 tablets (150 mg total) by mouth daily. 01/26/21   Jordan Lank, PA-C      Allergies    Prednisone and Latex    Review of Systems   Review of Systems  Constitutional:  Negative for chills, diaphoresis, fever and weight loss.  HENT:  Negative for ear pain, rhinorrhea and sore throat.   Eyes:  Negative for discharge.  Respiratory:  Positive for cough. Negative for shortness of breath and wheezing.   Cardiovascular:  Negative for chest pain.  Musculoskeletal:  Negative for myalgias.  Skin:  Negative for rash.  Neurological:  Negative for headaches.    Physical Exam Updated Vital Signs Pulse 90   Temp (!) 97.5 F (36.4 C) (Oral)   Resp 18   Ht 5' 4"  (1.626 m)   Wt 65.8 kg   LMP 01/18/2016 Comment: irregular periods  BMI 24.89 kg/m  Physical Exam Vitals and nursing note reviewed.  Constitutional:      General: She is not in acute distress.    Appearance: She is well-developed. She is not ill-appearing.  HENT:     Head: Normocephalic and atraumatic.     Nose: Nose normal.     Mouth/Throat:     Mouth: Mucous membranes  are moist.  Eyes:     Extraocular Movements: Extraocular movements intact.     Conjunctiva/sclera: Conjunctivae normal.     Pupils: Pupils are equal, round, and reactive to light.  Cardiovascular:     Rate and Rhythm: Normal rate and regular rhythm.     Pulses: Normal pulses.     Heart sounds: Normal heart sounds. No murmur heard. Pulmonary:     Effort: Pulmonary effort is normal. No respiratory distress.     Breath sounds: Normal breath sounds.  Abdominal:     Palpations: Abdomen is soft.     Tenderness: There is no abdominal tenderness.  Musculoskeletal:        General: No swelling.     Cervical back: Normal range of motion and neck  supple.  Skin:    General: Skin is warm and dry.     Capillary Refill: Capillary refill takes less than 2 seconds.  Neurological:     Mental Status: She is alert.  Psychiatric:        Mood and Affect: Mood normal.     ED Results / Procedures / Treatments   Labs (all labs ordered are listed, but only abnormal results are displayed) Labs Reviewed  SARS CORONAVIRUS 2 BY RT PCR    EKG None  Radiology DG Chest Portable 1 View  Result Date: 07/25/2022 CLINICAL DATA:  Cough.  Shortness of breath.  Chest congestion. EXAM: PORTABLE CHEST 1 VIEW COMPARISON:  06/23/2022 FINDINGS: The heart size and mediastinal contours are within normal limits. Both lungs are clear. The visualized skeletal structures are unremarkable. IMPRESSION: No active disease. Electronically Signed   By: Jordan Carrillo M.D.   On: 07/25/2022 10:22    Procedures Procedures    Medications Ordered in ED Medications  dexamethasone (DECADRON) tablet 10 mg (has no administration in time range)    ED Course/ Medical Decision Making/ A&P                           Medical Decision Making Amount and/or Complexity of Data Reviewed Radiology: ordered.   [name] is here with cough.  Normal vitals.  No fever.  She thinks she might of had COVID a few weeks ago but she never tested herself.  She wants a COVID test today.  She has no chest pain or shortness of breath.  She is very well-appearing.  Clear breath sounds.  Still with productive cough.  Chest x-ray per my review and interpretation shows no pneumonia or pneumothorax.  Overall suspect bronchitis/ongoing viral process.  We will treat with Decadron and Z-Pak.  I have no concern for ACS or PE.  EKG shows sinus rhythm.  No ischemic changes.  Overall given reassurance.  Understands return precautions.  This chart was dictated using voice recognition software.  Despite best efforts to proofread,  errors can occur which can change the documentation meaning.          Final Clinical Impression(s) / ED Diagnoses Final diagnoses:  Viral URI with cough    Rx / DC Orders ED Discharge Orders          Ordered    azithromycin (ZITHROMAX Z-PAK) 250 MG tablet        07/25/22 1028              Jordan Carrillo, [place], DO 07/25/22 1030

## 2022-07-25 NOTE — ED Triage Notes (Signed)
Pt states she feels like she had Covid a few weeks ago, pt has been coughing, productive. Pt denies any chest pain. Sob when she walks but states she feels dry.
